# Patient Record
Sex: Female | Born: 1985 | Race: White | Hispanic: No | Marital: Single | Smoking: Current some day smoker
Health system: Southern US, Community
[De-identification: ages and names within clinical notes are randomized; demographics above are authoritative.]

## PROBLEM LIST (undated history)

## (undated) DIAGNOSIS — F32A Depression, unspecified: Secondary | ICD-10-CM

## (undated) DIAGNOSIS — F419 Anxiety disorder, unspecified: Secondary | ICD-10-CM

## (undated) DIAGNOSIS — Z789 Other specified health status: Secondary | ICD-10-CM

## (undated) HISTORY — DX: Anxiety disorder, unspecified: F41.9

## (undated) HISTORY — DX: Depression, unspecified: F32.A

---

## 2009-09-26 DIAGNOSIS — Z8614 Personal history of Methicillin resistant Staphylococcus aureus infection: Secondary | ICD-10-CM

## 2009-09-26 HISTORY — DX: Personal history of Methicillin resistant Staphylococcus aureus infection: Z86.14

## 2012-06-19 ENCOUNTER — Ambulatory Visit: Payer: Self-pay | Admitting: Obstetrics and Gynecology

## 2012-06-26 ENCOUNTER — Ambulatory Visit: Payer: Self-pay | Admitting: Obstetrics and Gynecology

## 2012-07-27 ENCOUNTER — Ambulatory Visit: Payer: Self-pay | Admitting: Obstetrics and Gynecology

## 2012-08-12 ENCOUNTER — Inpatient Hospital Stay: Payer: Self-pay | Admitting: Obstetrics and Gynecology

## 2012-08-12 LAB — CBC WITH DIFFERENTIAL/PLATELET
Basophil #: 0 10*3/uL (ref 0.0–0.1)
Eosinophil %: 1.3 %
HCT: 36.1 % (ref 35.0–47.0)
HGB: 12.8 g/dL (ref 12.0–16.0)
Lymphocyte %: 19.9 %
MCV: 86 fL (ref 80–100)
Monocyte #: 0.6 x10 3/mm (ref 0.2–0.9)
Monocyte %: 6.2 %
Neutrophil %: 72.4 %
Platelet: 223 10*3/uL (ref 150–440)
RBC: 4.17 10*6/uL (ref 3.80–5.20)
RDW: 13.7 % (ref 11.5–14.5)

## 2014-05-27 ENCOUNTER — Ambulatory Visit: Payer: Self-pay | Admitting: Family Medicine

## 2015-01-13 NOTE — Op Note (Signed)
PATIENT NAME:  Brenda Phillips, Brenda R MR#:  [id number] DATE OF BIRTH:  03/27/1986  DATE OF PROCEDURE:  08/13/2012  PREOPERATIVE DIAGNOSIS: Active phase arrest.  POSTOPERATIVE DIAGNOSIS: Active phase arrest.  PROCEDURE: Primary low transverse cesarean section.   SURGEON: Jennell Cornerhomas Gaege Sangalang, MD  ASSISTANHilda Blades: Lugiano  ANESTHESIA: Surgical dosing of continuous lumbar epidural.  INDICATIONS: This is a 29 year old gravida 1, para 0, a patient brought in the night before for gestational diabetes and underwent Cervidil induction and labored for better part of the day, did not progress past 5 cm despite adequate contraction pattern, managed by nurse midwife, Hilda BladesLugiano.  DESCRIPTION OF PROCEDURE: After adequate surgical dosing of continuous lumbar epidural, the patient was prepped and draped in normal sterile fashion. The patient did receive 3 grams IV Ancef prior to commencement of the case.   A Pfannenstiel incision was made two fingerbreadths above the symphysis pubis. Sharp dissection was used to identify the fascia. The subcutaneous fat, approximately 4 cm. Fascia was identified, opened, and opened in transverse fashion. The superior aspect of the fascia was grasped with Kocher clamps. The recti muscle was dissected. The inferior aspect of the fascia was grasped with Kocher clamps and the pyramidalis muscle was dissected without difficulty. Peritoneal cavity was opened with sharp dissection without difficulty and the vesicouterine peritoneal fold was identified and opened and a bladder flap was created and the bladder was reflected inferiorly. A low transverse uterine incision was made. Upon entry into the endometrial cavity, clear fluid resulted. The incision was extended with blunt transverse traction. Wedged fetal head was brought to the incision and delivery of the head, shoulders, and body accomplished difficulty, a vigorous female whose cord was doubly clamped, passed to the neonatologist, Dr. Abundio MiuHorowitz, who  assigned Apgar scores of 9 and 9. Placenta was manually delivered. The uterus was exteriorized. The endometrial cavity was wiped clean with laparotomy tape. The uterine incision was closed with one chromic suture in running locking fashion with good approximation of edges, good hemostasis was noted. The ovaries and the fallopian tubes appeared normal. The posterior cul-de-sac was irrigated and suctioned. The uterus was placed back into the abdominal cavity and the paracolic gutters were wiped clean with laparotomy tape and the uterine incision again appeared hemostatic. Interceed was placed over the lower uterine incision site, in T-shaped fashion. The superior aspect of the fascia was grasped with Kocher clamps and the On-Q pump catheters were placed subfascially. The fascia was then closed above this with 0 Vicryl suture with good approximation of edges, good hemostasis noted. The subcutaneous tissues were irrigated and bovied for hemostasis and the subcutaneous tissue was closed with interrupted 2-0 Vicryl suture given the depth of the subcutaneous fat. The skin was reapproximated with Insorb staples and the On-Q pump catheters were secured at the skin level with Dermabond, Steri-Strips were placed, and Tegaderm placed over previously placed Steri-Strips. Each catheter was loaded with 5 mL of 0.5% Marcaine. There were no complications. The patient tolerated the procedure well. Estimated blood was 700 mL, intraoperative fluids 700 mL, and urine output 100 mL. The patient was taken to the recovery room in good condition.  ____________________________ Suzy Bouchardhomas J. Tennie Grussing, MD tjs:slb D: 08/13/2012 17:02:49 ET T: 08/14/2012 08:33:06 ET JOB#: [id number]  cc: Suzy Bouchardhomas J. Yazeed Pryer, MD, <Dictator> Suzy BouchardHOMAS J Gary Bultman MD ELECTRONICALLY SIGNED 08/20/2012 15:02

## 2015-02-03 NOTE — H&P (Signed)
L&D Evaluation:  History:   HPI 29 y/o G1 @ 39+wks EDC here for IOl @ term due to Gestational diabetes-well controlled with diet exercise. Care @ [facility]. Pregnancy also complicated by extreme obesity. Upon admission irregular mild contractions, denies leaking fluid or vaginal bleeding, baby is active. GBS negative.    Presents with above    Patient's Medical History No Chronic Illness    Patient's Surgical History none  (poor dental care) multiple teeth removed @ beginning of pregnancy    Medications Pre Natal Vitamins    Allergies NKDA    Social History none    Family History Non-Contributory   Exam:   Vital Signs stable    Urine Protein not completed, to lab    General no apparent distress    Mental Status clear    Chest clear  head cold past few days (feels bettter this am)    Heart normal sinus rhythm    Abdomen gravid, non-tender    Estimated Fetal Weight Average for gestational age    Fetal Position vtx    Fundal Height term    Back no CVAT    Edema 1+  pedal    Reflexes 1+    Clonus negative    Pelvic no external lesions, 2cm 50% vtx @ -1 BBOW nl show    Mebranes Intact    FHT normal rate with no decels, baseline 140's 150's avg variability with accels    Fetal Heart Rate 144    Ucx regular, q 2 mins 60 sec moderate    Skin dry    Lymph no lymphadenopathy   Impression:   Impression early labor, term IOL GDM   Plan:   Plan EFM/NST, monitor contractions and for cervical change    Comments Cervidil removed, pt requests epidural before AROM/beginning pitocin. Anesthesai notified. VSS AF FHR reactive, regular contractions. Explained what to expect, questions answererd. FOB @ bedside, supportive.   Electronic Signatures: Albertina ParrLugiano, Awesome Jared B (CNM)  (Signed [phone]-Nov-13 08:40)  Authored: L&D Evaluation   Last Updated: 18-Nov-13 08:40 by Albertina ParrLugiano, [place] Vanaken B (CNM)

## 2018-09-22 ENCOUNTER — Inpatient Hospital Stay
Admission: EM | Admit: 2018-09-22 | Discharge: 2018-09-24 | DRG: 871 | Disposition: A | Discharge status: Home / Self Care | Payer: Medicaid Other | Attending: Specialist | Admitting: Specialist

## 2018-09-22 ENCOUNTER — Other Ambulatory Visit: Payer: Self-pay

## 2018-09-22 ENCOUNTER — Emergency Department: Payer: Medicaid Other

## 2018-09-22 ENCOUNTER — Encounter: Payer: Self-pay | Admitting: Emergency Medicine

## 2018-09-22 DIAGNOSIS — Z91048 Other nonmedicinal substance allergy status: Secondary | ICD-10-CM

## 2018-09-22 DIAGNOSIS — E876 Hypokalemia: Secondary | ICD-10-CM | POA: Diagnosis present

## 2018-09-22 DIAGNOSIS — R7301 Impaired fasting glucose: Secondary | ICD-10-CM | POA: Diagnosis present

## 2018-09-22 DIAGNOSIS — J189 Pneumonia, unspecified organism: Secondary | ICD-10-CM | POA: Diagnosis present

## 2018-09-22 DIAGNOSIS — Z833 Family history of diabetes mellitus: Secondary | ICD-10-CM

## 2018-09-22 DIAGNOSIS — E871 Hypo-osmolality and hyponatremia: Secondary | ICD-10-CM | POA: Diagnosis present

## 2018-09-22 DIAGNOSIS — Z23 Encounter for immunization: Secondary | ICD-10-CM | POA: Diagnosis not present

## 2018-09-22 DIAGNOSIS — Z6841 Body Mass Index (BMI) 40.0 and over, adult: Secondary | ICD-10-CM

## 2018-09-22 DIAGNOSIS — Z87891 Personal history of nicotine dependence: Secondary | ICD-10-CM | POA: Diagnosis not present

## 2018-09-22 DIAGNOSIS — Z882 Allergy status to sulfonamides status: Secondary | ICD-10-CM

## 2018-09-22 DIAGNOSIS — A419 Sepsis, unspecified organism: Secondary | ICD-10-CM | POA: Diagnosis not present

## 2018-09-22 DIAGNOSIS — J9601 Acute respiratory failure with hypoxia: Secondary | ICD-10-CM | POA: Diagnosis present

## 2018-09-22 DIAGNOSIS — F17210 Nicotine dependence, cigarettes, uncomplicated: Secondary | ICD-10-CM | POA: Diagnosis present

## 2018-09-22 DIAGNOSIS — R0602 Shortness of breath: Secondary | ICD-10-CM | POA: Diagnosis present

## 2018-09-22 DIAGNOSIS — R0902 Hypoxemia: Secondary | ICD-10-CM

## 2018-09-22 HISTORY — DX: Other specified health status: Z78.9

## 2018-09-22 LAB — CBC WITH DIFFERENTIAL/PLATELET
Abs Immature Granulocytes: 0.11 10*3/uL — ABNORMAL HIGH (ref 0.00–0.07)
Basophils Absolute: 0 10*3/uL (ref 0.0–0.1)
Basophils Relative: 0 %
Eosinophils Absolute: 0 10*3/uL (ref 0.0–0.5)
Eosinophils Relative: 0 %
HCT: 41.4 % (ref 36.0–46.0)
Hemoglobin: 14.2 g/dL (ref 12.0–15.0)
Immature Granulocytes: 1 %
LYMPHS PCT: 14 %
Lymphs Abs: 1.7 10*3/uL (ref 0.7–4.0)
MCH: 29.8 pg (ref 26.0–34.0)
MCHC: 34.3 g/dL (ref 30.0–36.0)
MCV: 86.8 fL (ref 80.0–100.0)
Monocytes Absolute: 1 10*3/uL (ref 0.1–1.0)
Monocytes Relative: 9 %
Neutro Abs: 9 10*3/uL — ABNORMAL HIGH (ref 1.7–7.7)
Neutrophils Relative %: 76 %
Platelets: 402 10*3/uL — ABNORMAL HIGH (ref 150–400)
RBC: 4.77 MIL/uL (ref 3.87–5.11)
RDW: 13 % (ref 11.5–15.5)
WBC: 11.9 10*3/uL — AB (ref 4.0–10.5)
nRBC: 0 % (ref 0.0–0.2)

## 2018-09-22 LAB — URINALYSIS, COMPLETE (UACMP) WITH MICROSCOPIC
BACTERIA UA: NONE SEEN
Bilirubin Urine: NEGATIVE
Glucose, UA: NEGATIVE mg/dL
Hgb urine dipstick: NEGATIVE
KETONES UR: NEGATIVE mg/dL
Leukocytes, UA: NEGATIVE
Nitrite: NEGATIVE
Protein, ur: NEGATIVE mg/dL
Specific Gravity, Urine: 1.028 (ref 1.005–1.030)
pH: 7 (ref 5.0–8.0)

## 2018-09-22 LAB — EXPECTORATED SPUTUM ASSESSMENT W GRAM STAIN, RFLX TO RESP C: Special Requests: NORMAL

## 2018-09-22 LAB — BASIC METABOLIC PANEL
Anion gap: 13 (ref 5–15)
BUN: 6 mg/dL (ref 6–20)
CO2: 28 mmol/L (ref 22–32)
CREATININE: 0.67 mg/dL (ref 0.44–1.00)
Calcium: 8.9 mg/dL (ref 8.9–10.3)
Chloride: 93 mmol/L — ABNORMAL LOW (ref 98–111)
GFR calc Af Amer: >60 mL/min (ref >60)
GFR calc non Af Amer: >60 mL/min (ref >60)
Glucose, Bld: 121 mg/dL — ABNORMAL HIGH (ref 70–99)
Potassium: 2.9 mmol/L — ABNORMAL LOW (ref 3.5–5.1)
SODIUM: 134 mmol/L — AB (ref 135–145)

## 2018-09-22 LAB — I-STAT BETA HCG BLOOD, ED (NOT ORDERABLE): I-stat hCG, quantitative: <5 m[IU]/mL (ref <5)

## 2018-09-22 LAB — CG4 I-STAT (LACTIC ACID): LACTIC ACID, VENOUS: 1.57 mmol/L (ref 0.5–1.9)

## 2018-09-22 LAB — BRAIN NATRIURETIC PEPTIDE: B Natriuretic Peptide: 44 pg/mL (ref 0.0–100.0)

## 2018-09-22 LAB — PROCALCITONIN: Procalcitonin: 0.15 ng/mL

## 2018-09-22 LAB — TROPONIN I: Troponin I: <0.03 ng/mL (ref <0.03)

## 2018-09-22 LAB — MRSA PCR SCREENING: MRSA by PCR: NEGATIVE

## 2018-09-22 LAB — INFLUENZA PANEL BY PCR (TYPE A & B)
INFLBPCR: NEGATIVE
Influenza A By PCR: NEGATIVE

## 2018-09-22 MED ORDER — POTASSIUM CHLORIDE IN NACL 20-0.9 MEQ/L-% IV SOLN
INTRAVENOUS | Status: DC
  Administered 2018-09-22 – 2018-09-23 (×3): via INTRAVENOUS
  Filled 2018-09-22 (×5): qty 1000

## 2018-09-22 MED ORDER — BUDESONIDE 0.5 MG/2ML IN SUSP
0.5000 mg | Freq: Two times a day (BID) | RESPIRATORY_TRACT | Status: DC
  Administered 2018-09-22 – 2018-09-24 (×4): 0.5 mg via RESPIRATORY_TRACT
  Filled 2018-09-22 (×4): qty 2

## 2018-09-22 MED ORDER — ENOXAPARIN SODIUM 40 MG/0.4ML ~~LOC~~ SOLN
40.0000 mg | SUBCUTANEOUS | Status: DC
  Administered 2018-09-22: 23:00:00 40 mg via SUBCUTANEOUS
  Filled 2018-09-22: qty 0.4

## 2018-09-22 MED ORDER — POTASSIUM CHLORIDE CRYS ER 20 MEQ PO TBCR
40.0000 meq | EXTENDED_RELEASE_TABLET | Freq: Once | ORAL | Status: AC
  Administered 2018-09-22: 40 meq via ORAL
  Filled 2018-09-22: qty 2

## 2018-09-22 MED ORDER — GUAIFENESIN-DM 100-10 MG/5ML PO SYRP
5.0000 mL | ORAL_SOLUTION | ORAL | Status: DC | PRN
  Administered 2018-09-23 (×4): 5 mL via ORAL
  Filled 2018-09-22 (×5): qty 5

## 2018-09-22 MED ORDER — METHYLPREDNISOLONE SODIUM SUCC 125 MG IJ SOLR
125.0000 mg | Freq: Once | INTRAMUSCULAR | Status: AC
  Administered 2018-09-22: 125 mg via INTRAVENOUS
  Filled 2018-09-22: qty 2

## 2018-09-22 MED ORDER — SODIUM CHLORIDE 0.9 % IV SOLN
1.0000 g | Freq: Once | INTRAVENOUS | Status: AC
  Administered 2018-09-22: 1 g via INTRAVENOUS
  Filled 2018-09-22: qty 10

## 2018-09-22 MED ORDER — ACETAMINOPHEN 650 MG RE SUPP: 650.0000 mg | Freq: Four times a day (QID) | RECTAL | Status: DC | PRN

## 2018-09-22 MED ORDER — MAGNESIUM SULFATE 2 GM/50ML IV SOLN
2.0000 g | Freq: Once | INTRAVENOUS | Status: AC
  Administered 2018-09-22: 2 g via INTRAVENOUS
  Filled 2018-09-22: qty 50

## 2018-09-22 MED ORDER — ONDANSETRON HCL 4 MG PO TABS: 4.0000 mg | ORAL_TABLET | Freq: Four times a day (QID) | ORAL | Status: DC | PRN

## 2018-09-22 MED ORDER — IPRATROPIUM-ALBUTEROL 0.5-2.5 (3) MG/3ML IN SOLN
3.0000 mL | Freq: Four times a day (QID) | RESPIRATORY_TRACT | Status: DC
  Administered 2018-09-22 – 2018-09-24 (×7): 3 mL via RESPIRATORY_TRACT
  Filled 2018-09-22 (×8): qty 3

## 2018-09-22 MED ORDER — ALBUTEROL SULFATE (2.5 MG/3ML) 0.083% IN NEBU
5.0000 mg | INHALATION_SOLUTION | Freq: Once | RESPIRATORY_TRACT | Status: AC
  Administered 2018-09-22: 5 mg via RESPIRATORY_TRACT
  Filled 2018-09-22: qty 6

## 2018-09-22 MED ORDER — ACETAMINOPHEN 325 MG PO TABS: 650.0000 mg | ORAL_TABLET | Freq: Four times a day (QID) | ORAL | Status: DC | PRN

## 2018-09-22 MED ORDER — ONDANSETRON HCL 4 MG/2ML IJ SOLN: 4.0000 mg | Freq: Four times a day (QID) | INTRAMUSCULAR | Status: DC | PRN

## 2018-09-22 MED ORDER — IOPAMIDOL (ISOVUE-370) INJECTION 76%
75.0000 mL | Freq: Once | INTRAVENOUS | Status: AC | PRN
  Administered 2018-09-22: 75 mL via INTRAVENOUS

## 2018-09-22 MED ORDER — SODIUM CHLORIDE 0.9 % IV BOLUS
1000.0000 mL | Freq: Once | INTRAVENOUS | Status: AC
  Administered 2018-09-22: 1000 mL via INTRAVENOUS

## 2018-09-22 MED ORDER — METHYLPREDNISOLONE SODIUM SUCC 40 MG IJ SOLR
40.0000 mg | Freq: Every day | INTRAMUSCULAR | Status: DC
  Administered 2018-09-23 – 2018-09-24 (×2): 40 mg via INTRAVENOUS
  Filled 2018-09-22 (×2): qty 1

## 2018-09-22 MED ORDER — IPRATROPIUM BROMIDE 0.02 % IN SOLN
0.5000 mg | Freq: Once | RESPIRATORY_TRACT | Status: AC
  Administered 2018-09-22: 0.5 mg via RESPIRATORY_TRACT
  Filled 2018-09-22: qty 2.5

## 2018-09-22 MED ORDER — INFLUENZA VAC SPLIT QUAD 0.5 ML IM SUSY
0.5000 mL | PREFILLED_SYRINGE | INTRAMUSCULAR | Status: AC
  Administered 2018-09-24: 0.5 mL via INTRAMUSCULAR
  Filled 2018-09-22: qty 0.5

## 2018-09-22 MED ORDER — SODIUM CHLORIDE 0.9 % IV SOLN
500.0000 mg | Freq: Once | INTRAVENOUS | Status: AC
  Administered 2018-09-22: 500 mg via INTRAVENOUS
  Filled 2018-09-22: qty 500

## 2018-09-22 MED ORDER — GUAIFENESIN 100 MG/5ML PO SOLN
5.0000 mL | ORAL | Status: DC | PRN
  Filled 2018-09-22: qty 5

## 2018-09-22 MED ORDER — SODIUM CHLORIDE 0.9 % IV SOLN
500.0000 mg | INTRAVENOUS | Status: DC
  Administered 2018-09-23: 500 mg via INTRAVENOUS
  Filled 2018-09-22 (×2): qty 500

## 2018-09-22 MED ORDER — SODIUM CHLORIDE 0.9 % IV SOLN
1.0000 g | INTRAVENOUS | Status: DC
  Administered 2018-09-23 – 2018-09-24 (×2): 1 g via INTRAVENOUS
  Filled 2018-09-22 (×2): qty 1

## 2018-09-22 NOTE — ED Provider Notes (Addendum)
[facility] Emergency Department Provider Note  ____________________________________________   I have reviewed the triage vital signs and the nursing notes. Where available I have reviewed prior notes and, if possible and indicated, outside hospital notes.    HISTORY  Chief Complaint Shortness of Breath    HPI Brenda Phillips is a 32 y.o. female who does not have any significant medical problem, is a reformed smoker, smokes 1 or 2 cigarettes a day instead of the larger number she used to.  Does not vape, states that she has been having a cough and cold symptoms which she is showing with her children off and on this month.  She felt better earlier this month and then 4 days ago got sick again with URI symptoms cough cold rhinorrhea fever.  She denies any chest pain or leg swelling.  No personal family history of PE or DVT.  She states that over the last 2 days she is really been coughing a lot.  Cough is sometimes productive.  T-max is 101.9.  No personal family history of PE or DVT no recent travel.  Patient states that she just has trouble catching her breath with her coughing.  She is not on oxygen at baseline.  Sats were in the 80s for the outside clinic.    History reviewed. No pertinent past medical history.  There are no active problems to display for this patient.   Past Surgical History:  Procedure Laterality Date  . CESAREAN SECTION     x 2    Prior to Admission medications   Not on File    Allergies Sulfa antibiotics and Yellow dyes (non-tartrazine)  No family history on file.  Social History Social History   Tobacco Use  . Smoking status: Current Every Day Smoker  . Smokeless tobacco: Never Used  Substance Use Topics  . Alcohol use: Not Currently  . Drug use: Not Currently    Review of Systems Constitutional: + fever/chills Eyes: No visual changes. ENT: Positive mild sore throat. No stiff neck no neck pain, positive  rhinorrhea Cardiovascular: Denies chest pain. Respiratory:  + shortness of breath. Gastrointestinal:   no vomiting.  No diarrhea.  No constipation. Genitourinary: Negative for dysuria. Musculoskeletal: Negative lower extremity swelling Skin: Negative for rash. Neurological: Negative for severe headaches, focal weakness or numbness.   ____________________________________________   PHYSICAL EXAM:  VITAL SIGNS: ED Triage Vitals  Enc Vitals Group     BP 09/22/18 1306 (!) 149/73     Pulse Rate 09/22/18 1252 (!) 135     Resp 09/22/18 1252 20     Temp 09/22/18 1302 99.1 F (37.3 C)     Temp Source 09/22/18 1302 Oral     SpO2 09/22/18 1252 (!) 85 %     Weight 09/22/18 1259 233 lb (105.7 kg)     Height 09/22/18 1259 5\' 3"  (1.6 m)     Head Circumference --      Peak Flow --      Pain Score 09/22/18 1259 0     Pain Loc --      Pain Edu? --      Excl. in GC? --     Constitutional: Alert and oriented.  And actually appears better than her sats would suggest, she is not in significant distress but she is coughing and wheezing. Eyes: Conjunctivae are normal Head: Atraumatic HEENT: No congestion/rhinnorhea. Mucous membranes are moist.  Oropharynx non-erythematous Neck:   Nontender with no meningismus, no masses,  no stridor Cardiovascular: N mild tachycardia noted , regular rhythm. Grossly normal heart sounds.  Good peripheral circulation. Respiratory: Normal respiratory effort.  Cough and diffuse wheeze appreciated. Abdominal: Soft and nontender. No distention. No guarding no rebound Back:  There is no focal tenderness or step off.  there is no midline tenderness there are no lesions noted. there is no CVA tenderness Musculoskeletal: No lower extremity tenderness, no upper extremity tenderness. No joint effusions, no DVT signs strong distal pulses no edema Neurologic:  Normal speech and language. No gross focal neurologic deficits are appreciated.  Skin:  Skin is warm, dry and intact.  No rash noted. Psychiatric: Mood and affect are normal. Speech and behavior are normal.  ____________________________________________   LABS (all labs ordered are listed, but only abnormal results are displayed)  Labs Reviewed  CBC WITH DIFFERENTIAL/PLATELET - Abnormal; Notable for the following components:      Result Value   WBC 11.9 (*)    Platelets 402 (*)    Neutro Abs 9.0 (*)    Abs Immature Granulocytes 0.11 (*)    All other components within normal limits  CULTURE, BLOOD (ROUTINE X 2)  CULTURE, BLOOD (ROUTINE X 2)  BASIC METABOLIC PANEL  BRAIN NATRIURETIC PEPTIDE  TROPONIN I  URINALYSIS, COMPLETE (UACMP) WITH MICROSCOPIC  INFLUENZA PANEL BY PCR (TYPE A & B)  I-STAT CG4 LACTIC ACID, ED  I-STAT BETA HCG BLOOD, ED ([facility], [facility], [facility] ONLY)  CG4 I-STAT (LACTIC ACID)  I-STAT BETA HCG BLOOD, ED (NOT ORDERABLE)    Pertinent labs  results that were available during my care of the patient were reviewed by me and considered in my medical decision making (see chart for details). ____________________________________________  EKG  I personally interpreted any EKGs ordered by me or triage Tachycardia noted rate 104, sinus, no acute ST elevation depression nonspecific ST changes, no acute ischemia  ____________________________________________  RADIOLOGY  Pertinent labs & imaging results that were available during my care of the patient were reviewed by me and considered in my medical decision making (see chart for details). If possible, patient and/or family made aware of any abnormal findings.  Dg Chest 2 View  Result Date: 09/22/2018 CLINICAL DATA:  Patient with cough and congestion EXAM: CHEST - 2 VIEW COMPARISON:  None. FINDINGS: Monitoring leads overlie the patient. Normal cardiac and mediastinal contours. Coarse interstitial opacities within the right mid and lower lung and left lower lung. No pleural effusion. Regional skeleton is unremarkable. IMPRESSION: Right mid and lower  lung and left lung base coarse interstitial opacities may represent pneumonia or atypical infectious process. Given the appearance of the findings, recommend short-term follow-up chest radiograph to assess for interval improvement/resolution. Electronically Signed   By: Annia Beltrew  Davis M.D.   On: 09/22/2018 13:40    ----------------------------------------- 2:34 PM on 09/22/2018 -----------------------------------------  I personally looked at chest x-ray and CT scan, discussed ct with radiologist. ____________________________________________    PROCEDURES  Procedure(s) performed: None  Procedures  Critical Care performed: CRITICAL CARE Performed by: Jeanmarie PlantJAMES A Ronan Dion   Total critical care time: 45 minutes  Critical care time was exclusive of separately billable procedures and treating other patients.  Critical care was necessary to treat or prevent imminent or life-threatening deterioration.  Critical care was time spent personally by me on the following activities: development of treatment plan with patient and/or surrogate as well as nursing, discussions with consultants, evaluation of patient's response to treatment, examination of patient, obtaining history from patient or surrogate, ordering and performing  treatments and interventions, ordering and review of laboratory studies, ordering and review of radiographic studies, pulse oximetry and re-evaluation of patient's condition.   ____________________________________________   INITIAL IMPRESSION / ASSESSMENT AND PLAN / ED COURSE  Pertinent labs & imaging results that were available during my care of the patient were reviewed by me and considered in my medical decision making (see chart for details).  Patient here with cough and wheeze and URI symptoms and fevers at home.  She does not appear to be septic lactic is negative, we are we placed her on oxygen her sats at this time are at 98 on a neb.  She is breathing more easily  after the nebulizer treatment.  She is not pregnant.  Chest x-ray shows either pneumonia or atypical infectious process, given significant hypoxia we will evaluate whether CT is indicated for possible PE as patient did start her control about a month ago.  However, there is seems to be clear infectious process causing this  . ----------------------------------------- 2:34 PM on 09/22/2018 -----------------------------------------  Patient doing somewhat better after nebs, still nontoxic certainly does not need to be intubated, we are starting broad-spectrum with atypical coverage given patient CT finding of diffuse interstitial lung disease, patient does state that she does not vape she is a smoker she has been sick off and on this month starting early December she states got acutely worse over the last couple days.  Given hypoxia, we will admit her to the hospital I did talk to the hospitalist and they agree with management.  Appreciate consult   ____________________________________________   FINAL CLINICAL IMPRESSION(S) / ED DIAGNOSES  Final diagnoses:  None      This chart was dictated using voice recognition software.  Despite best efforts to proofread,  errors can occur which can change meaning.      Jeanmarie PlantMcShane, Bob Daversa A, MD 09/22/18 1348    Jeanmarie PlantMcShane, Keishawna Carranza A, MD 09/22/18 1435    Jeanmarie PlantMcShane, Jenesis Suchy A, MD 09/22/18 [phone]

## 2018-09-22 NOTE — ED Notes (Signed)
Antibiotics started prior to collectin of second blood culture due to difficult stick.

## 2018-09-22 NOTE — ED Notes (Signed)
Patient transported to X-ray 

## 2018-09-22 NOTE — H&P (Signed)
[facility] [place] [facility]   PATIENT NAME: Brenda Phillips    MR#:  [id number]  DATE OF BIRTH:  08-05-1986  DATE OF ADMISSION:  09/22/2018  PRIMARY CARE PHYSICIAN: [facility]   REQUESTING/REFERRING PHYSICIAN: Dr Ileana RoupJames McShane  CHIEF COMPLAINT:   Chief Complaint  Patient presents with  . Shortness of Breath    HISTORY OF PRESENT ILLNESS:  Brenda Alertmanda Phillips  is a 32 y.o. female presenting with shortness of breath.  She states that she has been having trouble breathing for the last 3 to 4 days.  Of eyes have been watering and crusting.  She has been coughing up greenish phlegm.  Her left ear has been hurting and decreased hearing and ringing.  She has had a fever up to almost 102 at home.  She went to her doctor and they sent her into the ER for further evaluation.  In the ER she was found to be hypoxic with a pulse ox in the 80s on room air.  He was also found to have pneumonia on chest x-ray.  CT scan of the chest was ordered by the ER physician and still pending at this time.  Hospitalist services were contacted for further evaluation.  Of note patient does not vape.  She states she did vape around 3 years ago but not now.  PAST MEDICAL HISTORY:   Past Medical History:  Diagnosis Date  . Medical history non-contributory     PAST SURGICAL HISTORY:   Past Surgical History:  Procedure Laterality Date  . CESAREAN SECTION     x 2    SOCIAL HISTORY:   Social History   Tobacco Use  . Smoking status: Former Games developermoker  . Smokeless tobacco: Never Used  Substance Use Topics  . Alcohol use: Not Currently    FAMILY HISTORY:   Family History  Problem Relation Age of Onset  . Diabetes Mother   . Liver disease Father     DRUG ALLERGIES:   Allergies  Allergen Reactions  . Sulfa Antibiotics   . Yellow Dyes (Non-Tartrazine)     REVIEW OF SYSTEMS:  CONSTITUTIONAL: Positive for fever, chills and sweats.  Positive for  weight loss.  Positive for fatigue.  EYES: Positive for crusting on her eyes. EARS, NOSE, AND THROAT: Positive for tinnitus.  Ear pain and decreased hearing.  Positive for runny nose.  No sore throat. RESPIRATORY: Positive for cough and shortness of breath.no wheezing or hemoptysis.  CARDIOVASCULAR: No chest pain, orthopnea, edema.  GASTROINTESTINAL: No nausea, vomiting, or abdominal pain. No blood in bowel movements.  Some diarrhea GENITOURINARY: No dysuria.  Positive for dark urine ENDOCRINE: No polyuria, nocturia,  HEMATOLOGY: No anemia, easy bruising or bleeding SKIN: No rash or lesion. MUSCULOSKELETAL: Some neck and back pain. NEUROLOGIC: No tingling, numbness, weakness.  PSYCHIATRY: No anxiety or depression.   MEDICATIONS AT HOME:   Prior to Admission medications   Medication Sig Start Date End Date Taking? Authorizing Provider  norgestimate-ethinyl estradiol (SPRINTEC 28) 0.25-35 MG-MCG tablet Take 1 tablet by mouth daily.   Yes [provider]      VITAL SIGNS:  Blood pressure 122/71, pulse (!) 109, temperature 99.1 F (37.3 C), temperature source Oral, resp. rate 15, height 5\' 3"  (1.6 m), weight 105.7 kg, last menstrual period 09/13/2018, SpO2 (!) 88 %.  PHYSICAL EXAMINATION:  GENERAL:  32 y.o.-year-old patient lying in the bed with no acute distress.  EYES: Pupils equal, round, reactive to light and  accommodation. No scleral icterus. Extraocular muscles intact.  HEENT: Head atraumatic, normocephalic. Oropharynx and nasopharynx clear.  NECK:  Supple, no jugular venous distention. No thyroid enlargement, no tenderness.  LUNGS: Decreased breath sounds bilaterally.  Slight expiratory wheezing.  No rales,rhonchi or crepitation. No use of accessory muscles of respiration.  CARDIOVASCULAR: S1, S2 tachycardia. No murmurs, rubs, or gallops.  ABDOMEN: Soft, nontender, nondistended. Bowel sounds present. No organomegaly or mass.  EXTREMITIES: No pedal edema, cyanosis, or  clubbing.  NEUROLOGIC: Cranial nerves II through XII are intact. Muscle strength 5/5 in all extremities. Sensation intact. Gait not checked.  PSYCHIATRIC: The patient is alert and oriented x 3.  SKIN: No rash, lesion, or ulcer.   LABORATORY PANEL:   CBC Recent Labs  Lab 09/22/18 1307  WBC 11.9*  HGB 14.2  HCT 41.4  PLT 402*   ------------------------------------------------------------------------------------------------------------------  Chemistries  Recent Labs  Lab 09/22/18 1307  NA 134*  K 2.9*  CL 93*  CO2 28  GLUCOSE 121*  BUN 6  CREATININE 0.67  CALCIUM 8.9   ------------------------------------------------------------------------------------------------------------------    RADIOLOGY:  Dg Chest 2 View  Result Date: 09/22/2018 CLINICAL DATA:  Patient with cough and congestion EXAM: CHEST - 2 VIEW COMPARISON:  None. FINDINGS: Monitoring leads overlie the patient. Normal cardiac and mediastinal contours. Coarse interstitial opacities within the right mid and lower lung and left lower lung. No pleural effusion. Regional skeleton is unremarkable. IMPRESSION: Right mid and lower lung and left lung base coarse interstitial opacities may represent pneumonia or atypical infectious process. Given the appearance of the findings, recommend short-term follow-up chest radiograph to assess for interval improvement/resolution. Electronically Signed   By: Annia Beltrew  Davis M.D.   On: 09/22/2018 13:40    EKG:   Sinus tachycardia, left atrial enlargement, nonspecific ST-T wave changes  IMPRESSION AND PLAN:   1.  Clinical sepsis with bilateral pneumonia.  IV Rocephin and Zithromax.  Follow-up blood cultures.  Sputum culture ordered.  Check a procalcitonin. 2.  Acute hypoxic respiratory failure.  Oxygen supplementation.  Steroids given in the emergency room.  I will continue low-dose steroids currently. 3.  Hypokalemia give IV magnesium and oral and IV potassium 4.  Hyponatremia IV  fluid hydration. 5.  Obesity with a BMI of 41.27.  Weight loss needed 6.  Impaired fasting glucose add on a hemoglobin A1c   All the records are reviewed and case discussed with ED provider. Management plans discussed with the patient, family and they are in agreement.  CODE STATUS: Full code  TOTAL TIME TAKING CARE OF THIS PATIENT: 50 minutes.    Alford Highlandichard Elfida Shimada M.D on 09/22/2018 at 2:50 PM  Between 7am to 6pm - Pager - [phone]  After 6pm call admission pager [phone]  [facility] Office  [phone]  CC: Primary care physician; [facility]

## 2018-09-22 NOTE — ED Triage Notes (Signed)
[facility] Upon arrival pts SpO2 was 85% on room air. Pt was placed on 2 liter O2 via [place] and taken to treatment room 4

## 2018-09-22 NOTE — ED Notes (Addendum)
Stuck for blood cultures by this RN unsuccessful. Lab aware that second set of cultures have not been drawn.

## 2018-09-22 NOTE — ED Notes (Signed)
Patient transported to CT 

## 2018-09-22 NOTE — ED Triage Notes (Signed)
First Nurse Note:  Arrives from [facility] for [facility] evaluation of cough, sob.  According to paperwork, sats were 85%.  OX sat checked in lobby - 85% and pulse 134.  Placed patient on 2l/[place] in Triage 1.

## 2018-09-22 NOTE — ED Notes (Signed)
Pt given specimen cup to collect respiratory panel.

## 2018-09-22 NOTE — ED Notes (Signed)
Nurse not able to take report at this time. Will call back.

## 2018-09-23 LAB — RESPIRATORY PANEL BY PCR
Adenovirus: NOT DETECTED
Bordetella pertussis: NOT DETECTED
Chlamydophila pneumoniae: NOT DETECTED
Coronavirus 229E: NOT DETECTED
Coronavirus HKU1: NOT DETECTED
Coronavirus NL63: NOT DETECTED
Coronavirus OC43: NOT DETECTED
INFLUENZA A-RVPPCR: NOT DETECTED
Influenza B: NOT DETECTED
Metapneumovirus: NOT DETECTED
Mycoplasma pneumoniae: NOT DETECTED
PARAINFLUENZA VIRUS 3-RVPPCR: NOT DETECTED
PARAINFLUENZA VIRUS 4-RVPPCR: NOT DETECTED
Parainfluenza Virus 1: NOT DETECTED
Parainfluenza Virus 2: NOT DETECTED
Respiratory Syncytial Virus: DETECTED — AB
Rhinovirus / Enterovirus: NOT DETECTED

## 2018-09-23 LAB — CBC
HEMATOCRIT: 36.1 % (ref 36.0–46.0)
Hemoglobin: 11.8 g/dL — ABNORMAL LOW (ref 12.0–15.0)
MCH: 29.5 pg (ref 26.0–34.0)
MCHC: 32.7 g/dL (ref 30.0–36.0)
MCV: 90.3 fL (ref 80.0–100.0)
Platelets: 370 10*3/uL (ref 150–400)
RBC: 4 MIL/uL (ref 3.87–5.11)
RDW: 13.2 % (ref 11.5–15.5)
WBC: 11.6 10*3/uL — ABNORMAL HIGH (ref 4.0–10.5)
nRBC: 0 % (ref 0.0–0.2)

## 2018-09-23 LAB — BASIC METABOLIC PANEL
Anion gap: 7 (ref 5–15)
BUN: 8 mg/dL (ref 6–20)
CO2: 26 mmol/L (ref 22–32)
Calcium: 8.2 mg/dL — ABNORMAL LOW (ref 8.9–10.3)
Chloride: 104 mmol/L (ref 98–111)
Creatinine, Ser: 0.5 mg/dL (ref 0.44–1.00)
GFR calc Af Amer: >60 mL/min (ref >60)
GFR calc non Af Amer: >60 mL/min (ref >60)
GLUCOSE: 143 mg/dL — AB (ref 70–99)
Potassium: 3.9 mmol/L (ref 3.5–5.1)
Sodium: 137 mmol/L (ref 135–145)

## 2018-09-23 LAB — PROCALCITONIN: Procalcitonin: <0.1 ng/mL

## 2018-09-23 MED ORDER — ENOXAPARIN SODIUM 40 MG/0.4ML ~~LOC~~ SOLN
40.0000 mg | Freq: Two times a day (BID) | SUBCUTANEOUS | Status: DC
  Administered 2018-09-23 (×2): 40 mg via SUBCUTANEOUS
  Filled 2018-09-23 (×2): qty 0.4

## 2018-09-23 NOTE — Progress Notes (Signed)
Patient ID: Brenda Phillips, female   DOB: 20-Apr-1986, 32 y.o.   MRN: [id number]  [employer] PROGRESS NOTE  [facility]anda [facility] WGN:[id number] DOB: 20-Apr-1986 DOA: 09/22/2018 [facility]CP: [facility]  HPI/Subjective: Patient feels like she is breathing better than yesterday.  Still with a dry cough.  Still with shortness of breath and wheeze.  Objective: Vitals:   09/22/18 2020 09/23/18 0607  BP: 123/70 129/75  Pulse: (!) 111 62  Resp: 16 12  Temp: 98.7 F (37.1 C) 97.6 F (36.4 C)  SpO2: 96% 95%    Filed Weights   09/22/18 1259  Weight: 105.7 kg    ROS: Review of Systems  Constitutional: Negative for chills and fever.  Eyes: Negative for blurred vision.  Respiratory: Positive for cough, shortness of breath and wheezing.   Cardiovascular: Negative for chest pain.  Gastrointestinal: Negative for abdominal pain, constipation, diarrhea, nausea and vomiting.  Genitourinary: Negative for dysuria.  Musculoskeletal: Negative for joint pain.  Neurological: Negative for dizziness and headaches.   Exam: Physical Exam  Constitutional: She is oriented to person, place, and time.  HENT:  Nose: No mucosal edema.  Mouth/Throat: No oropharyngeal exudate or posterior oropharyngeal edema.  Eyes: Pupils are equal, round, and reactive to light. Conjunctivae, EOM and lids are normal.  Neck: No JVD present. Carotid bruit is not present. No edema present. No thyroid mass and no thyromegaly present.  Cardiovascular: S1 normal and S2 normal. Tachycardia present. Exam reveals no gallop.  No murmur heard. Pulses:      Dorsalis pedis pulses are 2+ on the right side and 2+ on the left side.  Respiratory: No respiratory distress. She has decreased breath sounds in the right middle field, the right lower field, the left middle field and the left lower field. She has wheezes in the right lower field and the left lower field. She has no rhonchi. She has no rales.  GI: Soft.  Bowel sounds are normal. There is no abdominal tenderness.  Musculoskeletal:     Right ankle: She exhibits no swelling.     Left ankle: She exhibits no swelling.  Lymphadenopathy:    She has no cervical adenopathy.  Neurological: She is alert and oriented to person, place, and time. No cranial nerve deficit.  Skin: Skin is warm. No rash noted. Nails show no clubbing.  Psychiatric: She has a normal mood and affect.      Data Reviewed: Basic Metabolic Panel: Recent Labs  Lab 09/22/18 1307 09/23/18 0609  NA 134* 137  K 2.9* 3.9  CL 93* 104  CO2 28 26  GLUCOSE 121* 143*  BUN 6 8  CREATININE 0.67 0.50  CALCIUM 8.9 8.2*   CBC: Recent Labs  Lab 09/22/18 1307 09/23/18 0609  WBC 11.9* 11.6*  NEUTROABS 9.0*  --   HGB 14.2 11.8*  HCT 41.4 36.1  MCV 86.8 90.3  PLT 402* 370   Cardiac Enzymes: Recent Labs  Lab 09/22/18 1617  TROPONINI <0.03   BNP (last 3 results) Recent Labs    09/22/18 1307  BNP 44.0     Recent Results (from the past 240 hour(s))  Culture, blood (routine x 2)     Status: None (Preliminary result)   Collection Time: 09/22/18  1:06 PM  Result Value Ref Range Status   Specimen Description BLOOD RIGHT ANTECUBITAL  Final   Special Requests   Final    BOTTLES DRAWN AEROBIC AND ANAEROBIC Blood Culture adequate volume   Culture  Final    NO GROWTH < 24 HOURS Performed at [facility], [address] [place], [place] [address]    Report Status PENDING  Incomplete  Culture, blood (routine x 2)     Status: None (Preliminary result)   Collection Time: 09/22/18  4:17 PM  Result Value Ref Range Status   Specimen Description BLOOD BLOOD RIGHT HAND  Final   Special Requests   Final    BOTTLES DRAWN AEROBIC AND ANAEROBIC Blood Culture adequate volume   Culture   Final    NO GROWTH < 24 HOURS Performed at [facility], [address] [place], [place] [address]    Report Status PENDING  Incomplete  MRSA PCR Screening     Status:  None   Collection Time: 09/22/18  4:20 PM  Result Value Ref Range Status   MRSA by PCR NEGATIVE NEGATIVE Final    Comment:        The GeneXpert MRSA Assay (FDA approved for NASAL specimens only), is one component of a comprehensive MRSA colonization surveillance program. It is not intended to diagnose MRSA infection nor to guide or monitor treatment for MRSA infections. Performed at [facility], [address] [place], [place] [address]   Expectorated sputum assessment w rflx to resp cult     Status: None   Collection Time: 09/22/18  4:23 PM  Result Value Ref Range Status   Specimen Description SPUTUM  Final   Special Requests Normal  Final   Sputum evaluation   Final    THIS SPECIMEN IS ACCEPTABLE FOR SPUTUM CULTURE Performed at [facility], [address] [place], [place] [address]    Report Status 09/22/2018 FINAL  Final  Culture, respiratory     Status: None (Preliminary result)   Collection Time: 09/22/18  4:23 PM  Result Value Ref Range Status   Specimen Description SPUTUM  Final   Special Requests   Final    Normal Reflexed from [id number] Performed at [facility], [address] [place], [place] [address]    Gram Stain   Final    ABUNDANT WBC PRESENT, PREDOMINANTLY PMN FEW GRAM POSITIVE COCCI FEW GRAM NEGATIVE RODS FEW GRAM POSITIVE RODS    Culture PENDING  Incomplete   Report Status PENDING  Incomplete     Studies: Dg Chest 2 View  Result Date: 09/22/2018 CLINICAL DATA:  Patient with cough and congestion EXAM: CHEST - 2 VIEW COMPARISON:  None. FINDINGS: Monitoring leads overlie the patient. Normal cardiac and mediastinal contours. Coarse interstitial opacities within the right mid and lower lung and left lower lung. No pleural effusion. Regional skeleton is unremarkable. IMPRESSION: Right mid and lower lung and left lung base coarse interstitial opacities may represent pneumonia or atypical infectious process. Given the  appearance of the findings, recommend short-term follow-up chest radiograph to assess for interval improvement/resolution. Electronically Signed   By: Annia Beltrew  Davis M.D.   On: 09/22/2018 13:40   Ct Angio Chest Pe W And/or Wo Contrast  Result Date: 09/22/2018 CLINICAL DATA:  Cough, shortness of breath, hypoxia EXAM: CT ANGIOGRAPHY CHEST WITH CONTRAST TECHNIQUE: Multidetector CT imaging of the chest was performed using the standard protocol during bolus administration of intravenous contrast. Multiplanar CT image reconstructions and MIPs were obtained to evaluate the vascular anatomy. CONTRAST:  75mL ISOVUE-370 IOPAMIDOL (ISOVUE-370) INJECTION 76% COMPARISON:  None. FINDINGS: Cardiovascular: Heart size normal. Trace pericardial fluid. RV is nondilated. Satisfactory opacification of pulmonary arteries noted, and there is no evidence  of pulmonary emboli. Adequate contrast opacification of the thoracic aorta with no evidence of dissection, aneurysm, or stenosis. There is classic 3-vessel brachiocephalic arch anatomy without proximal stenosis. No significant atheromatous change. Mediastinum/Nodes: No hilar or mediastinal adenopathy. Lungs/Pleura: No pleural effusion. No pneumothorax. Extensive scattered poorly marginated small airspace opacities throughout basilar segments of both lower lobes, right middle lobe and lingula. Mild scattered airspace opacities in the upper lobes, right worse than left. There are focal areas of fairly dense consolidation in the lung bases and right middle lobe, with air bronchograms. Upper Abdomen: No acute findings. Musculoskeletal: No chest wall abnormality. No acute or significant osseous findings. Review of the MIP images confirms the above findings. IMPRESSION: 1.   Negative for acute PE or thoracic aortic dissection. 2. Extensive bilateral airspace disease involving bases more than apices suggesting multifocal pneumonia or other infectious process, hypersensitivity or inflammatory  process, less likely atypical edema. Electronically Signed   By: Corlis Leak M.D.   On: 09/22/2018 14:48    Scheduled Meds: . budesonide (PULMICORT) nebulizer solution  0.5 mg Nebulization BID  . enoxaparin (LOVENOX) injection  40 mg Subcutaneous Q24H  . Influenza vac split quadrivalent PF  0.5 mL Intramuscular Tomorrow-1000  . ipratropium-albuterol  3 mL Nebulization Q6H  . methylPREDNISolone (SOLU-MEDROL) injection  40 mg Intravenous Daily   Continuous Infusions: . 0.9 % NaCl with KCl 20 mEq / L 75 mL/hr at 09/23/18 0715  . azithromycin    . cefTRIAXone (ROCEPHIN)  IV      Assessment/Plan:  1. Clinical sepsis with bilateral extensive pneumonia.  Patient on IV Rocephin and Zithromax.  Follow-up blood cultures.  Sputum culture also needs to be followed up.  First procalcitonin slightly elevated and second procalcitonin has normalized.  Respiratory viral panel still pending at this time. 2. Acute hypoxic respiratory failure.  Try to taper off oxygen.  Continue Solu-Medrol daily.  Patient with slight wheeze.  Evaluate again tomorrow 3. Hypokalemia.  This has been replaced 4. Hyponatremia.  This has also normalized 5. Morbid obesity with a BMI of 41.27 6. Impaired fasting glucose.  Hemoglobin A1c still pending  Code Status:     Code Status Orders  (From admission, onward)         Start     Ordered   09/22/18 1439  Full code  Continuous     09/22/18 1439        Code Status History    This patient has a current code status but no historical code status.     Disposition Plan: Evaluate daily on when to go home  Antibiotics:  Rocephin  Zithromax  Time spent: 25 minutes  Marcellina Jonsson [employer]

## 2018-09-23 NOTE — Progress Notes (Signed)
PHARMACIST - PHYSICIAN COMMUNICATION  CONCERNING:  Enoxaparin (Lovenox) for DVT Prophylaxis    RECOMMENDATION: Patient was prescribed enoxaprin 40mg  q24 hours for VTE prophylaxis.   Filed Weights   09/22/18 1259  Weight: 233 lb (105.7 kg)    Body mass index is 41.27 kg/m.  Estimated Creatinine Clearance: 116.4 mL/min (by C-G formula based on SCr of 0.5 mg/dL).   Based on [facility] policy patient is candidate for enoxaparin 40mg  every 12 hour dosing due to BMI being >40.   DESCRIPTION: Pharmacy has adjusted enoxaparin dose per [facility] policy.  Patient is now receiving enoxaparin 40mg  every 12 hours.   Carola FrostNathan A Tawan Degroote, PharmD Clinical Pharmacist  09/23/2018 11:35 AM

## 2018-09-24 LAB — HIV ANTIBODY (ROUTINE TESTING W REFLEX): HIV Screen 4th Generation wRfx: NONREACTIVE

## 2018-09-24 LAB — PROCALCITONIN: Procalcitonin: 0.1 ng/mL

## 2018-09-24 MED ORDER — LEVOFLOXACIN 750 MG PO TABS: 750.0000 mg | ORAL_TABLET | Freq: Every day | ORAL | 0 refills | Status: AC

## 2018-09-24 MED ORDER — GUAIFENESIN-DM 100-10 MG/5ML PO SYRP: 5.0000 mL | ORAL_SOLUTION | ORAL | 0 refills | Status: DC | PRN

## 2018-09-24 MED ORDER — PREDNISONE 10 MG PO TABS: ORAL_TABLET | ORAL | 0 refills | Status: DC

## 2018-09-24 NOTE — Plan of Care (Signed)
Pt care plan adequate for discharge. Pt refused wheelchair transport to vehicle upon discharge. All questions addressed at time of discharge.

## 2018-09-24 NOTE — Discharge Summary (Signed)
[facility] -  at [facility]   PATIENT NAME: Brenda Phillips    MR#:  [id number]  DATE OF BIRTH:  14-Nov-1985  DATE OF ADMISSION:  09/22/2018 ADMITTING PHYSICIAN: Alford Highlandichard Wieting, MD  DATE OF DISCHARGE: 09/24/2018  PRIMARY CARE PHYSICIAN: [facility]    ADMISSION DIAGNOSIS:  Hypoxia [R09.02] Community acquired pneumonia, unspecified laterality [J18.9]  DISCHARGE DIAGNOSIS:  Active Problems:   Sepsis (HCC)   SECONDARY DIAGNOSIS:   Past Medical History:  Diagnosis Date  . Medical history non-contributory     HOSPITAL COURSE:   1. Clinical sepsis with bilateral extensive pneumonia.  Patient was treated with IV ceftriaxone, Zithromax.  She has clinically improved, her respiratory panel was negative, blood cultures remain negative.  She is now afebrile and hemodynamically stable.  She is being discharged on oral prednisone and Levaquin for a few days.   2. Acute hypoxic respiratory failure.    Due to pneumonia.  Patient was treated with IV ceftriaxone, Zithromax, given some IV steroids and duo nebs due to her bronchospasm.  She has improved.  She is no longer hypoxic.  She is being discharged on oral prednisone taper and Levaquin as mentioned below.   3. Hypokalemia.  This has been replaced and resolved now. 4. Hyponatremia.  This has also normalized with IV fluids  Stable to be discharged Home.   DISCHARGE CONDITIONS:   Stable.   CONSULTS OBTAINED:    DRUG ALLERGIES:   Allergies  Allergen Reactions  . Sulfa Antibiotics   . Yellow Dyes (Non-Tartrazine)     DISCHARGE MEDICATIONS:   Allergies as of 09/24/2018      Reactions   Sulfa Antibiotics    Yellow Dyes (non-tartrazine)       Medication List    TAKE these medications   guaiFENesin-dextromethorphan 100-10 MG/5ML syrup Commonly known as:  ROBITUSSIN DM Take 5 mLs by mouth every 4 (four) hours as needed for cough (chest congestion).   levofloxacin 750 MG  tablet Commonly known as:  LEVAQUIN Take 1 tablet (750 mg total) by mouth daily for 6 days.   predniSONE 10 MG tablet Commonly known as:  DELTASONE Label  & dispense according to the schedule below. 5 Pills PO for 1 day then, 4 Pills PO for 1 day, 3 Pills PO for 1 day, 2 Pills PO for 1 day, 1 Pill PO for 1 days then STOP. Notes to patient:  Take as ordered    SPRINTEC 28 0.25-35 MG-MCG tablet Generic drug:  norgestimate-ethinyl estradiol Take 1 tablet by mouth daily.         DISCHARGE INSTRUCTIONS:   DIET:  Regular diet  DISCHARGE CONDITION:  Stable  ACTIVITY:  Activity as tolerated  OXYGEN:  Home Oxygen: No.   Oxygen Delivery: room air  DISCHARGE LOCATION:  home   If you experience worsening of your admission symptoms, develop shortness of breath, life threatening emergency, suicidal or homicidal thoughts you must seek medical attention immediately by calling 911 or calling your MD immediately  if symptoms less severe.  You Must read complete instructions/literature along with all the possible adverse reactions/side effects for all the Medicines you take and that have been prescribed to you. Take any new Medicines after you have completely understood and accpet all the possible adverse reactions/side effects.   Please note  You were cared for by a hospitalist during your hospital stay. If you have any questions about your discharge medications or the care you received while you were  in the hospital after you are discharged, you can call the unit and asked to speak with the hospitalist on call if the hospitalist that took care of you is not available. Once you are discharged, your primary care physician will handle any further medical issues. Please note that NO REFILLS for any discharge medications will be authorized once you are discharged, as it is imperative that you return to your primary care physician (or establish a relationship with a primary care physician if you  do not have one) for your aftercare needs so that they can reassess your need for medications and monitor your lab values.     Today   No wheezing, bronchospasm.  Afebrile and hemodynamically stable.  Respiratory panel is negative, cultures remain negative.  Will discharge home today on oral antibiotics and prednisone taper.  VITAL SIGNS:  Blood pressure 128/81, pulse 98, temperature 97.8 F (36.6 C), temperature source Oral, resp. rate 12, height 5\' 3"  (1.6 m), weight 105.7 kg, last menstrual period 09/13/2018, SpO2 98 %.  I/O:    Intake/Output Summary (Last 24 hours) at 09/24/2018 1350 Last data filed at 09/24/2018 0049 Gross per 24 hour  Intake 1862.88 ml  Output -  Net 1862.88 ml    PHYSICAL EXAMINATION:  GENERAL:  32 y.o.-year-old patient lying in the bed with no acute distress.  EYES: Pupils equal, round, reactive to light and accommodation. No scleral icterus. Extraocular muscles intact.  HEENT: Head atraumatic, normocephalic. Oropharynx and nasopharynx clear.  NECK:  Supple, no jugular venous distention. No thyroid enlargement, no tenderness.  LUNGS: Normal breath sounds bilaterally, no wheezing, rales,rhonchi. No use of accessory muscles of respiration.  CARDIOVASCULAR: S1, S2 normal. No murmurs, rubs, or gallops.  ABDOMEN: Soft, non-tender, non-distended. Bowel sounds present. No organomegaly or mass.  EXTREMITIES: No pedal edema, cyanosis, or clubbing.  NEUROLOGIC: Cranial nerves II through XII are intact. No focal motor or sensory defecits b/l.  PSYCHIATRIC: The patient is alert and oriented x 3.  SKIN: No obvious rash, lesion, or ulcer.   DATA REVIEW:   CBC Recent Labs  Lab 09/23/18 0609  WBC 11.6*  HGB 11.8*  HCT 36.1  PLT 370    Chemistries  Recent Labs  Lab 09/23/18 0609  NA 137  K 3.9  CL 104  CO2 26  GLUCOSE 143*  BUN 8  CREATININE 0.50  CALCIUM 8.2*    Cardiac Enzymes Recent Labs  Lab 09/22/18 1617  TROPONINI <0.03     Microbiology Results  Results for orders placed or performed during the hospital encounter of 09/22/18  Culture, blood (routine x 2)     Status: None (Preliminary result)   Collection Time: 09/22/18  1:06 PM  Result Value Ref Range Status   Specimen Description BLOOD RIGHT ANTECUBITAL  Final   Special Requests   Final    BOTTLES DRAWN AEROBIC AND ANAEROBIC Blood Culture adequate volume   Culture   Final    NO GROWTH 2 DAYS Performed at [facility] [address], [place], [place] [address]    Report Status PENDING  Incomplete  Respiratory Panel by PCR     Status: Abnormal   Collection Time: 09/22/18  3:00 PM  Result Value Ref Range Status   Adenovirus NOT DETECTED NOT DETECTED Final   Coronavirus 229E NOT DETECTED NOT DETECTED Final   Coronavirus HKU1 NOT DETECTED NOT DETECTED Final   Coronavirus NL63 NOT DETECTED NOT DETECTED Final   Coronavirus OC43 NOT DETECTED NOT DETECTED Final  Metapneumovirus NOT DETECTED NOT DETECTED Final   Rhinovirus / Enterovirus NOT DETECTED NOT DETECTED Final   Influenza A NOT DETECTED NOT DETECTED Final   Influenza B NOT DETECTED NOT DETECTED Final   Parainfluenza Virus 1 NOT DETECTED NOT DETECTED Final   Parainfluenza Virus 2 NOT DETECTED NOT DETECTED Final   Parainfluenza Virus 3 NOT DETECTED NOT DETECTED Final   Parainfluenza Virus 4 NOT DETECTED NOT DETECTED Final   Respiratory Syncytial Virus DETECTED (A) NOT DETECTED Final    Comment: CRITICAL RESULT CALLED TO, READ BACK BY AND VERIFIED WITH:  RN Tomma LightningWHITHERSPOON, M 1112 [phone] FCP    Bordetella pertussis NOT DETECTED NOT DETECTED Final   Chlamydophila pneumoniae NOT DETECTED NOT DETECTED Final   Mycoplasma pneumoniae NOT DETECTED NOT DETECTED Final  Culture, blood (routine x 2)     Status: None (Preliminary result)   Collection Time: 09/22/18  4:17 PM  Result Value Ref Range Status   Specimen Description BLOOD BLOOD RIGHT HAND  Final   Special Requests   Final    BOTTLES  DRAWN AEROBIC AND ANAEROBIC Blood Culture adequate volume   Culture   Final    NO GROWTH 2 DAYS Performed at [facility], [address] [place], [place] [address]    Report Status PENDING  Incomplete  MRSA PCR Screening     Status: None   Collection Time: 09/22/18  4:20 PM  Result Value Ref Range Status   MRSA by PCR NEGATIVE NEGATIVE Final    Comment:        The GeneXpert MRSA Assay (FDA approved for NASAL specimens only), is one component of a comprehensive MRSA colonization surveillance program. It is not intended to diagnose MRSA infection nor to guide or monitor treatment for MRSA infections. Performed at [facility], [address] [place], [place] [address]   Expectorated sputum assessment w rflx to resp cult     Status: None   Collection Time: 09/22/18  4:23 PM  Result Value Ref Range Status   Specimen Description SPUTUM  Final   Special Requests Normal  Final   Sputum evaluation   Final    THIS SPECIMEN IS ACCEPTABLE FOR SPUTUM CULTURE Performed at [facility], [address] [place], [place] [address]    Report Status 09/22/2018 FINAL  Final  Culture, respiratory     Status: None (Preliminary result)   Collection Time: 09/22/18  4:23 PM  Result Value Ref Range Status   Specimen Description SPUTUM  Final   Special Requests   Final    Normal Reflexed from [id number] Performed at [facility], [address] [place], [place] [address]    Gram Stain   Final    ABUNDANT WBC PRESENT, PREDOMINANTLY PMN FEW GRAM POSITIVE COCCI FEW GRAM NEGATIVE RODS FEW GRAM POSITIVE RODS    Culture FEW Consistent with normal respiratory flora.  Final   Report Status PENDING  Incomplete    RADIOLOGY:  Ct Angio Chest Pe W And/or Wo Contrast  Result Date: 09/22/2018 CLINICAL DATA:  Cough, shortness of breath, hypoxia EXAM: CT ANGIOGRAPHY CHEST WITH CONTRAST TECHNIQUE: Multidetector CT imaging of the chest was performed using the  standard protocol during bolus administration of intravenous contrast. Multiplanar CT image reconstructions and MIPs were obtained to evaluate the vascular anatomy. CONTRAST:  75mL ISOVUE-370 IOPAMIDOL (ISOVUE-370) INJECTION 76% COMPARISON:  None. FINDINGS: Cardiovascular: Heart size normal. Trace pericardial fluid. RV is nondilated. Satisfactory opacification of pulmonary arteries noted, and there is no evidence  of pulmonary emboli. Adequate contrast opacification of the thoracic aorta with no evidence of dissection, aneurysm, or stenosis. There is classic 3-vessel brachiocephalic arch anatomy without proximal stenosis. No significant atheromatous change. Mediastinum/Nodes: No hilar or mediastinal adenopathy. Lungs/Pleura: No pleural effusion. No pneumothorax. Extensive scattered poorly marginated small airspace opacities throughout basilar segments of both lower lobes, right middle lobe and lingula. Mild scattered airspace opacities in the upper lobes, right worse than left. There are focal areas of fairly dense consolidation in the lung bases and right middle lobe, with air bronchograms. Upper Abdomen: No acute findings. Musculoskeletal: No chest wall abnormality. No acute or significant osseous findings. Review of the MIP images confirms the above findings. IMPRESSION: 1.   Negative for acute PE or thoracic aortic dissection. 2. Extensive bilateral airspace disease involving bases more than apices suggesting multifocal pneumonia or other infectious process, hypersensitivity or inflammatory process, less likely atypical edema. Electronically Signed   By: Corlis Leak M.D.   On: 09/22/2018 14:48      Management plans discussed with the patient, family and they are in agreement.  CODE STATUS:     Code Status Orders  (From admission, onward)         Start     Ordered   09/22/18 1439  Full code  Continuous     09/22/18 1439        Code Status History    This patient has a current code status but  no historical code status.      TOTAL TIME TAKING CARE OF THIS PATIENT: 40 minutes.    Houston Siren M.D on 09/24/2018 at 1:50 PM  Between 7am to 6pm - Pager - [phone]  After 6pm go to [email or web address] - Therapist, [facility] Hospitalists  Office  [phone]  CC: Primary care physician; [facility]

## 2018-09-25 LAB — CULTURE, RESPIRATORY W GRAM STAIN
Culture: NORMAL
Special Requests: NORMAL

## 2018-09-27 LAB — CULTURE, BLOOD (ROUTINE X 2)
Culture: NO GROWTH
Culture: NO GROWTH
Special Requests: ADEQUATE
Special Requests: ADEQUATE

## 2020-04-20 ENCOUNTER — Other Ambulatory Visit: Payer: Self-pay

## 2020-04-20 ENCOUNTER — Emergency Department
Admission: EM | Admit: 2020-04-20 | Discharge: 2020-04-20 | Disposition: A | Discharge status: Home / Self Care | Payer: Medicaid Other | Attending: Emergency Medicine | Admitting: Emergency Medicine

## 2020-04-20 ENCOUNTER — Emergency Department: Payer: Medicaid Other

## 2020-04-20 ENCOUNTER — Encounter: Payer: Self-pay | Admitting: Emergency Medicine

## 2020-04-20 DIAGNOSIS — R52 Pain, unspecified: Secondary | ICD-10-CM

## 2020-04-20 DIAGNOSIS — Z87891 Personal history of nicotine dependence: Secondary | ICD-10-CM | POA: Insufficient documentation

## 2020-04-20 DIAGNOSIS — K429 Umbilical hernia without obstruction or gangrene: Secondary | ICD-10-CM

## 2020-04-20 DIAGNOSIS — K469 Unspecified abdominal hernia without obstruction or gangrene: Secondary | ICD-10-CM

## 2020-04-20 DIAGNOSIS — R1907 Generalized intra-abdominal and pelvic swelling, mass and lump: Secondary | ICD-10-CM | POA: Insufficient documentation

## 2020-04-20 LAB — CBC
HCT: 43.7 % (ref 36.0–46.0)
Hemoglobin: 15.4 g/dL — ABNORMAL HIGH (ref 12.0–15.0)
MCH: 31.4 pg (ref 26.0–34.0)
MCHC: 35.2 g/dL (ref 30.0–36.0)
MCV: 89 fL (ref 80.0–100.0)
Platelets: 307 10*3/uL (ref 150–400)
RBC: 4.91 MIL/uL (ref 3.87–5.11)
RDW: 12.5 % (ref 11.5–15.5)
WBC: 10.6 10*3/uL — ABNORMAL HIGH (ref 4.0–10.5)
nRBC: 0 % (ref 0.0–0.2)

## 2020-04-20 LAB — BASIC METABOLIC PANEL
Anion gap: 8 (ref 5–15)
BUN: 7 mg/dL (ref 6–20)
CO2: 28 mmol/L (ref 22–32)
Calcium: 9.1 mg/dL (ref 8.9–10.3)
Chloride: 103 mmol/L (ref 98–111)
Creatinine, Ser: 0.75 mg/dL (ref 0.44–1.00)
GFR calc Af Amer: >60 mL/min (ref >60)
GFR calc non Af Amer: >60 mL/min (ref >60)
Glucose, Bld: 90 mg/dL (ref 70–99)
Potassium: 4.1 mmol/L (ref 3.5–5.1)
Sodium: 139 mmol/L (ref 135–145)

## 2020-04-20 LAB — POCT PREGNANCY, URINE: Preg Test, Ur: NEGATIVE

## 2020-04-20 LAB — LACTIC ACID, PLASMA: Lactic Acid, Venous: 1.1 mmol/L (ref 0.5–1.9)

## 2020-04-20 LAB — PREGNANCY, URINE: Preg Test, Ur: NEGATIVE

## 2020-04-20 MED ORDER — SENNOSIDES-DOCUSATE SODIUM 8.6-50 MG PO TABS: 2.0000 | ORAL_TABLET | Freq: Two times a day (BID) | ORAL | 0 refills | Status: DC

## 2020-04-20 NOTE — ED Provider Notes (Signed)
[facility] Emergency Department Provider Note  ____________________________________________  Time seen: Approximately 7:12 PM  I have reviewed the triage vital signs and the nursing notes.   HISTORY  Chief Complaint GI Problem    HPI Brenda Phillips is a 34 y.o. female with no significant past medical history who comes the ED complaining of decreased appetite for the past few days and noticing a bulge in the center of her abdomen that gets bigger with coughing or bending over or straining with constipation.  Denies vomiting.  Has had decreased bowel movements for the past 3 days.  No fevers or chills.  No sudden severe pain, but bulges associated with some nonlocalized mild discomfort..    Past Medical History:  Diagnosis Date  . Medical history non-contributory      Patient Active Problem List   Diagnosis Date Noted  . Sepsis (HCC) 09/22/2018     Past Surgical History:  Procedure Laterality Date  . CESAREAN SECTION     x 2     Prior to Admission medications   Medication Sig Start Date End Date Taking? Authorizing Provider  guaiFENesin-dextromethorphan (ROBITUSSIN DM) 100-10 MG/5ML syrup Take 5 mLs by mouth every 4 (four) hours as needed for cough (chest congestion). 09/24/18   Houston Siren, MD  norgestimate-ethinyl estradiol (SPRINTEC 28) 0.25-35 MG-MCG tablet Take 1 tablet by mouth daily.    [provider]  predniSONE (DELTASONE) 10 MG tablet Label  & dispense according to the schedule below. 5 Pills PO for 1 day then, 4 Pills PO for 1 day, 3 Pills PO for 1 day, 2 Pills PO for 1 day, 1 Pill PO for 1 days then STOP. 09/24/18   Houston Siren, MD  senna-docusate (SENOKOT-S) 8.6-50 MG tablet Take 2 tablets by mouth 2 (two) times daily. 04/20/20   Sharman Cheek, MD     Allergies Sulfa antibiotics and Yellow dyes (non-tartrazine)   Family History  Problem Relation Age of Onset  . Diabetes Mother   . Liver disease Father      Social History Social History   Tobacco Use  . Smoking status: Former Games developer  . Smokeless tobacco: Never Used  Vaping Use  . Vaping Use: Former  Substance Use Topics  . Alcohol use: Not Currently  . Drug use: Not Currently    Review of Systems  Constitutional:   No fever or chills.  ENT:   No sore throat. No rhinorrhea. Cardiovascular:   No chest pain or syncope. Respiratory:   No dyspnea or cough. Gastrointestinal:   Positive as above for abdominal pain without vomiting and diarrhea.  Musculoskeletal:   Negative for focal pain or swelling All other systems reviewed and are negative except as documented above in ROS and HPI.  ____________________________________________   PHYSICAL EXAM:  VITAL SIGNS: ED Triage Vitals  Enc Vitals Group     BP 04/20/20 1716 (!) 132/77     Pulse Rate 04/20/20 1716 64     Resp 04/20/20 1716 18     Temp 04/20/20 1716 99.8 F (37.7 C)     Temp Source 04/20/20 1716 Oral     SpO2 04/20/20 1716 100 %     Weight 04/20/20 1621 (!) 235 lb (106.6 kg)     Height 04/20/20 1621 5\' 4"  (1.626 m)     Head Circumference --      Peak Flow --      Pain Score 04/20/20 1621 0     Pain Loc --  Pain Edu? --      Excl. in GC? --     Vital signs reviewed, nursing assessments reviewed.   Constitutional:   Alert and oriented. Non-toxic appearance. Eyes:   Conjunctivae are normal. EOMI. PERRL. ENT      Head:   Normocephalic and atraumatic.      Nose:   Wearing a mask.      Mouth/Throat:   Wearing a mask.      Neck:   No meningismus. Full ROM. Hematological/Lymphatic/Immunilogical:   No inguinal lymphadenopathy. Cardiovascular:   RRR. Symmetric bilateral radial and DP pulses.  No murmurs. Cap refill less than 2 seconds. Respiratory:   Normal respiratory effort without tachypnea/retractions. Breath sounds are clear and equal bilaterally. No wheezes/rales/rhonchi. Gastrointestinal:   Soft without tenderness to deep palpation.  There is a 1 cm  umbilical hernia with a small amount of tissue entrapped.  This was reduced with manipulation at the bedside.  Symptoms improved afterward.. Non distended. There is no CVA tenderness.  No rebound, rigidity, or guarding. Musculoskeletal:   Normal range of motion in all extremities. No joint effusions.  No lower extremity tenderness.  No edema. Neurologic:   Normal speech and language.  Motor grossly intact. No acute focal neurologic deficits are appreciated.  Skin:    Skin is warm, dry and intact. No rash noted.  No petechiae, purpura, or bullae.  ____________________________________________    LABS (pertinent positives/negatives) (all labs ordered are listed, but only abnormal results are displayed) Labs Reviewed  CBC - Abnormal; Notable for the following components:      Result Value   WBC 10.6 (*)    Hemoglobin 15.4 (*)    All other components within normal limits  BASIC METABOLIC PANEL  LACTIC ACID, PLASMA  PREGNANCY, URINE  LACTIC ACID, PLASMA   ____________________________________________   EKG    ____________________________________________    RADIOLOGY  DG Abd 2 Views  Result Date: 04/20/2020 CLINICAL DATA:  Dyspepsia, abdominal pain EXAM: ABDOMEN - 2 VIEW COMPARISON:  None. FINDINGS: The bowel gas pattern is normal. There is no evidence of free air. No radio-opaque calculi or other significant radiographic abnormality is seen. IMPRESSION: Negative. Electronically Signed   By: Helyn Numbers MD   On: 04/20/2020 19:28    ____________________________________________   PROCEDURES Procedures  ____________________________________________    CLINICAL IMPRESSION / ASSESSMENT AND PLAN / ED COURSE  Medications ordered in the ED: Medications - No data to display  Pertinent labs & imaging results that were available during my care of the patient were reviewed by me and considered in my medical decision making (see chart for details).  Brenda Phillips was  evaluated in Emergency Department on 04/20/2020 for the symptoms described in the history of present illness. She was evaluated in the context of the global COVID-19 pandemic, which necessitated consideration that the patient might be at risk for infection with the SARS-CoV-2 virus that causes COVID-19. Institutional protocols and algorithms that pertain to the evaluation of patients at risk for COVID-19 are in a state of rapid change based on information released by regulatory bodies including the CDC and federal and state organizations. These policies and algorithms were followed during the patient's care in the ED.   Patient presents with abdominal pain and a bulge noted at home.  On exam she has an apparent umbilical hernia which is small, likely fat-containing, reducible at bedside.  No incarceration or strangulation.  Abdomen is not distended, doubt small bowel obstruction.  Will obtain  a 2 view x-ray to look for signs of bowel obstruction, if reassuring patient can be discharged home to follow-up with general surgery clinic.  Labs are normal.  Pregnancy test negative, vital signs normal.   Considering the patient's symptoms, medical history, and physical examination today, I have low suspicion for cholecystitis or biliary pathology, pancreatitis, perforation or bowel obstruction, hernia, intra-abdominal abscess, AAA or dissection, volvulus or intussusception, mesenteric ischemia, or appendicitis.  Doubt STI PID TOA or torsion  Patient has not been vaccinated against Covid.  She is agreeable to obtaining a vaccine.  Advised her on options for obtaining vaccine at the time  of her choosing and encouraged her to do so..  ----------------------------------------- 7:53 PM on 04/20/2020 -----------------------------------------  Abdominal x-ray negative, no signs of obstruction.      ____________________________________________   FINAL CLINICAL IMPRESSION(S) / ED DIAGNOSES    Final diagnoses:   Umbilical hernia without obstruction and without gangrene  Morbid obesity [facility])     ED Discharge Orders         Ordered    senna-docusate (SENOKOT-S) 8.6-50 MG tablet  2 times daily     Discontinue  Reprint     04/20/20 1912          Portions of this note were generated with dragon dictation software. Dictation errors may occur despite best attempts at proofreading.   Sharman Cheek, MD 04/20/20 [phone]

## 2020-04-20 NOTE — ED Triage Notes (Signed)
Pt reports has had problems with digestion for a few days and her husband noticed a knot of sorts in her abd that is more pronounced when she coughs. Pt reports unsure of what it is and wanted to be checked out. Pt also with constipation. States small BM when they do move, last real good one was 2-3 days ago.

## 2020-05-05 ENCOUNTER — Ambulatory Visit (INDEPENDENT_AMBULATORY_CARE_PROVIDER_SITE_OTHER): Payer: Self-pay | Admitting: Surgery

## 2020-05-05 ENCOUNTER — Ambulatory Visit: Payer: Self-pay | Admitting: Surgery

## 2020-05-05 ENCOUNTER — Encounter: Payer: Self-pay | Admitting: Surgery

## 2020-05-05 ENCOUNTER — Other Ambulatory Visit: Payer: Self-pay

## 2020-05-05 VITALS — BP 124/78 | HR 75 | Temp 98.8°F | Ht 64.0 in | Wt 192.0 lb

## 2020-05-05 DIAGNOSIS — E669 Obesity, unspecified: Secondary | ICD-10-CM

## 2020-05-05 DIAGNOSIS — K439 Ventral hernia without obstruction or gangrene: Secondary | ICD-10-CM

## 2020-05-05 DIAGNOSIS — Z6832 Body mass index (BMI) 32.0-32.9, adult: Secondary | ICD-10-CM

## 2020-05-05 NOTE — Progress Notes (Signed)
Patient ID: Brenda Phillips, female   DOB: Jun 18, 1986, 34 y.o.   MRN: [id number]  Chief Complaint: Umbilical hernia  History of Present Illness Brenda Phillips is a 34 y.o. female with a long known history of an irregular appearance of her umbilical area.  She has been pregnant twice with 2 C-sections, both completed with Pfannenstiel incisions.  Has recently lost some weight and noticed increased bulge present at the cephalad aspect of her umbilicus.  She was seen in the ED for evaluation where it was readily reduced, she denied having much in the way of pain it just concerned her sufficiently to warrant the ED visit.  She reports she does not utilize her abdominal musculature much, likely due to some degree of diastases recti that she had developed/worsened with her pregnancy.  And overall she does not do a significant amount of heavy lifting, and has had no chronic symptoms like cough or sneezing that is exacerbated any periumbilical pain.  She does admit to a history of nausea, she has had constipation and she is a current smoker.  Past Medical History Past Medical History:  Diagnosis Date  . Medical history non-contributory       Past Surgical History:  Procedure Laterality Date  . CESAREAN SECTION     x 2    Allergies  Allergen Reactions  . Sulfa Antibiotics   . Yellow Dyes (Non-Tartrazine)     Current Outpatient Medications  Medication Sig Dispense Refill  . norgestimate-ethinyl estradiol (SPRINTEC 28) 0.25-35 MG-MCG tablet Take 1 tablet by mouth daily.    . Sennosides (SENNA LAX PO) Take by mouth.     No current facility-administered medications for this visit.    Family History Family History  Problem Relation Age of Onset  . Diabetes Mother   . Liver disease Father       Social History Social History   Tobacco Use  . Smoking status: Current Some Day Smoker    Packs/day: 0.15    Types: Cigarettes  . Smokeless tobacco: Never Used  Vaping Use  . Vaping  Use: Former  Substance Use Topics  . Alcohol use: Not Currently  . Drug use: Not Currently        Review of Systems  Constitutional: Negative.   HENT: Negative.   Eyes: Negative.   Respiratory: Negative.   Cardiovascular: Negative.   Gastrointestinal: Negative for blood in stool, diarrhea, heartburn, melena and vomiting.  Genitourinary: Negative.   Skin: Negative.   Neurological: Negative.   Psychiatric/Behavioral: Negative.       Physical Exam Blood pressure 124/78, pulse 75, temperature 98.8 F (37.1 C), height 5\' 4"  (1.626 m), weight 192 lb (87.1 kg), last menstrual period 04/18/2020, SpO2 98 %. Last Weight  Most recent update: 05/05/2020 11:24 AM   Weight  87.1 kg (192 lb)            CONSTITUTIONAL: Well developed, and nourished, appropriately responsive and aware without distress.   EYES: Sclera non-icteric.   EARS, NOSE, MOUTH AND THROAT: Mask worn.   Hearing is intact to voice.  NECK: Trachea is midline, and there is no jugular venous distension.  LYMPH NODES:  Lymph nodes in the neck are not enlarged. RESPIRATORY:  Lungs are clear, and breath sounds are equal bilaterally. Normal respiratory effort without pathologic use of accessory muscles. CARDIOVASCULAR: Heart is regular in rate and rhythm. GI: The abdomen is  soft, nontender, and nondistended. There were no palpable masses.   She has readily notable  diastases recti, with a prominent bulge on the cephalad aspect of her umbilicus.  It is difficult to fully appreciate the size of the periumbilical defect, due to the degree of diastases present immediately adjacent to it.  She does not reproduce the hernia bulge but it is readily apparent grossly.  I did not appreciate hepatosplenomegaly. There were normal bowel sounds. MUSCULOSKELETAL:  Symmetrical muscle tone appreciated in all four extremities.    SKIN: Skin turgor is normal. No pathologic skin lesions appreciated.  NEUROLOGIC:  Motor and sensation appear grossly  normal.  Cranial nerves are grossly without defect. PSYCH:  Alert and oriented to person, place and time. Affect is appropriate for situation.  Data Reviewed I have personally reviewed what is currently available of the patient's imaging, recent labs and medical records.   Labs:  CBC Latest Ref Rng & Units 04/20/2020 09/23/2018 09/22/2018  WBC 4.0 - 10.5 K/uL 10.6(H) 11.6(H) 11.9(H)  Hemoglobin 12.0 - 15.0 g/dL 15.4(H) 11.8(L) 14.2  Hematocrit 36 - 46 % 43.7 36.1 41.4  Platelets 150 - 400 K/uL 307 370 402(H)   CMP Latest Ref Rng & Units 04/20/2020 09/23/2018 09/22/2018  Glucose 70 - 99 mg/dL 90 106(Y) 694(W)  BUN 6 - 20 mg/dL 7 8 6   Creatinine 0.44 - 1.00 mg/dL 5.46 2.70  Sodium 135 - 145 mmol/L 139 137 134(L)  Potassium 3.5 - 5.1 mmol/L 4.1 3.9 2.9(L)  Chloride 98 - 111 mmol/L 103 104 93(L)  CO2 22 - 32 mmol/L 28 26 28   Calcium 8.9 - 10.3 mg/dL 9.1 3.50) 8.9      Imaging:  Within last 24 hrs: No results found.  Assessment    Diastases recti, periumbilical ventral hernia. Patient Active Problem List   Diagnosis Date Noted  . Sepsis (HCC) 09/22/2018    Plan    Robotic ventral hernia repair with mesh.  Anticipating a degree of plication of her linea alba.  We discussed the difficult transition between plicated linea alba and the persistent epigastric diastases.  We all are in agreement not to pursue any type of component separation to avoid this transition.  We did discuss other options in terms of primary suture repair or direct open hernia repair with placement of mesh.  I feel more comfortable with a intraperitoneal placement of mesh to reinforce the primary closure.  I believe this would be best completed robotically. We discussed risks of mesh, risks of recurrence, risks of complications involving the mesh including infection/transition/adhesions bowel obstruction etc. I discussed possibility of incarceration, strangulation, enlargement in size over time, and the risk  of emergency surgery in the face of strangulation.  Also discussed the risk of surgery including recurrence which can be up to 30% in the case of complex hernias, use of prosthetic materials (mesh) and the increased risk of infxn, post-op infxn and the possible need for re-operation and removal of mesh if used, possibility of post-op SBO or ileus, and the risks of general anesthetic including MI, CVA, sudden death or even reaction to anesthetic medications. The patient and family understands the risks, any and all questions were answered to the patient's satisfaction.  We also discussed smoking cessation, that I will obtain a cotinine prior to her surgery and my strong advice that she have at least 2 full weeks of cessation prior to her general anesthetic.  She believes that will not be an issue, though she feels she may likely resume smoking after her surgery is past. Face-to-face time spent with the patient  and accompanying care providers(if present) was 30 minutes, with more than 50% of the time spent counseling, educating, and coordinating care of the patient.      Campbell Lerner M.D., FACS 05/05/2020, 1:06 PM

## 2020-05-05 NOTE — Patient Instructions (Addendum)
Try to quit smoking prior to surgery.  You have requested for your Umbilical Hernia be repaired. This will be done at [facility] by Dr Claudine Mouton. Please see your (blue)pre-care sheet for information. Our surgery scheduler will be in contact with you to schedule your surgery and go over information.   You will need to arrange to be off work for 1-2 weeks but will have to have a lifting restriction of no more than 15-20 lbs for 6 weeks following your surgery.   Umbilical Hernia, Adult A hernia is a bulge of tissue that pushes through an opening between muscles. An umbilical hernia happens in the abdomen, near the belly button (umbilicus). The hernia may contain tissues from the small intestine, large intestine, or fatty tissue covering the intestines (omentum). Umbilical hernias in adults tend to get worse over time, and they require surgical treatment. There are several types of umbilical hernias. You may have:  A hernia located just above or below the umbilicus (indirect hernia). This is the most common type of umbilical hernia in adults.  A hernia that forms through an opening formed by the umbilicus (direct hernia).  A hernia that comes and goes (reducible hernia). A reducible hernia may be visible only when you strain, lift something heavy, or cough. This type of hernia can be pushed back into the abdomen (reduced).  A hernia that traps abdominal tissue inside the hernia (incarcerated hernia). This type of hernia cannot be reduced.  A hernia that cuts off blood flow to the tissues inside the hernia (strangulated hernia). The tissues can start to die if this happens. This type of hernia requires emergency treatment.  What are the causes? An umbilical hernia happens when tissue inside the abdomen presses on a weak area of the abdominal muscles. What increases the risk? You may have a greater risk of this condition if you:  Are obese.  Have had several pregnancies.  Have a buildup of fluid  inside your abdomen (ascites).  Have had surgery that weakens the abdominal muscles.  What are the signs or symptoms? The main symptom of this condition is a painless bulge at or near the belly button. A reducible hernia may be visible only when you strain, lift something heavy, or cough. Other symptoms may include:  Dull pain.  A feeling of pressure.  Symptoms of a strangulated hernia may include:  Pain that gets increasingly worse.  Nausea and vomiting.  Pain when pressing on the hernia.  Skin over the hernia becoming red or purple.  Constipation.  Blood in the stool.  How is this diagnosed? This condition may be diagnosed based on:  A physical exam. You may be asked to cough or strain while standing. These actions increase the pressure inside your abdomen and force the hernia through the opening in your muscles. Your health care provider may try to reduce the hernia by pressing on it.  Your symptoms and medical history.  How is this treated? Surgery is the only treatment for an umbilical hernia. Surgery for a strangulated hernia is done as soon as possible. If you have a small hernia that is not incarcerated, you may need to lose weight before having surgery. Follow these instructions at home:  Lose weight, if told by your health care provider.  Do not try to push the hernia back in.  Watch your hernia for any changes in color or size. Tell your health care provider if any changes occur.  You may need to avoid activities that  increase pressure on your hernia.  Do not lift anything that is heavier than 10 lb (4.5 kg) until your health care provider says that this is safe.  Take over-the-counter and prescription medicines only as told by your health care provider.  Keep all follow-up visits as told by your health care provider. This is important. Contact a health care provider if:  Your hernia gets larger.  Your hernia becomes painful. Get help right away  if:  You develop sudden, severe pain near the area of your hernia.  You have pain as well as nausea or vomiting.  You have pain and the skin over your hernia changes color.  You develop a fever. This information is not intended to replace advice given to you by your health care provider. Make sure you discuss any questions you have with your health care provider. Document Released: 02/12/2016 Document Revised: 05/15/2016 Document Reviewed: 02/12/2016 Elsevier Interactive Patient Education  [employer].

## 2020-05-05 NOTE — H&P (View-Only) (Signed)
Patient ID: Brenda Phillips, female   DOB: 04/21/1986, 34 y.o.   MRN: [id number]  Chief Complaint: Umbilical hernia  History of Present Illness Brenda Phillips is a 34 y.o. female with a long known history of an irregular appearance of her umbilical area.  She has been pregnant twice with 2 C-sections, both completed with Pfannenstiel incisions.  Has recently lost some weight and noticed increased bulge present at the cephalad aspect of her umbilicus.  She was seen in the ED for evaluation where it was readily reduced, she denied having much in the way of pain it just concerned her sufficiently to warrant the ED visit.  She reports she does not utilize her abdominal musculature much, likely due to some degree of diastases recti that she had developed/worsened with her pregnancy.  And overall she does not do a significant amount of heavy lifting, and has had no chronic symptoms like cough or sneezing that is exacerbated any periumbilical pain.  She does admit to a history of nausea, she has had constipation and she is a current smoker.  Past Medical History Past Medical History:  Diagnosis Date  . Medical history non-contributory       Past Surgical History:  Procedure Laterality Date  . CESAREAN SECTION     x 2    Allergies  Allergen Reactions  . Sulfa Antibiotics   . Yellow Dyes (Non-Tartrazine)     Current Outpatient Medications  Medication Sig Dispense Refill  . norgestimate-ethinyl estradiol (SPRINTEC 28) 0.25-35 MG-MCG tablet Take 1 tablet by mouth daily.    . Sennosides (SENNA LAX PO) Take by mouth.     No current facility-administered medications for this visit.    Family History Family History  Problem Relation Age of Onset  . Diabetes Mother   . Liver disease Father       Social History Social History   Tobacco Use  . Smoking status: Current Some Day Smoker    Packs/day: 0.15    Types: Cigarettes  . Smokeless tobacco: Never Used  Vaping Use  . Vaping  Use: Former  Substance Use Topics  . Alcohol use: Not Currently  . Drug use: Not Currently        Review of Systems  Constitutional: Negative.   HENT: Negative.   Eyes: Negative.   Respiratory: Negative.   Cardiovascular: Negative.   Gastrointestinal: Negative for blood in stool, diarrhea, heartburn, melena and vomiting.  Genitourinary: Negative.   Skin: Negative.   Neurological: Negative.   Psychiatric/Behavioral: Negative.       Physical Exam Blood pressure 124/78, pulse 75, temperature 98.8 F (37.1 C), height 5' 4" (1.626 m), weight 192 lb (87.1 kg), last menstrual period 04/18/2020, SpO2 98 %. Last Weight  Most recent update: 05/05/2020 11:24 AM   Weight  87.1 kg (192 lb)            CONSTITUTIONAL: Well developed, and nourished, appropriately responsive and aware without distress.   EYES: Sclera non-icteric.   EARS, NOSE, MOUTH AND THROAT: Mask worn.   Hearing is intact to voice.  NECK: Trachea is midline, and there is no jugular venous distension.  LYMPH NODES:  Lymph nodes in the neck are not enlarged. RESPIRATORY:  Lungs are clear, and breath sounds are equal bilaterally. Normal respiratory effort without pathologic use of accessory muscles. CARDIOVASCULAR: Heart is regular in rate and rhythm. GI: The abdomen is  soft, nontender, and nondistended. There were no palpable masses.   She has readily notable   diastases recti, with a prominent bulge on the cephalad aspect of her umbilicus.  It is difficult to fully appreciate the size of the periumbilical defect, due to the degree of diastases present immediately adjacent to it.  She does not reproduce the hernia bulge but it is readily apparent grossly.  I did not appreciate hepatosplenomegaly. There were normal bowel sounds. MUSCULOSKELETAL:  Symmetrical muscle tone appreciated in all four extremities.    SKIN: Skin turgor is normal. No pathologic skin lesions appreciated.  NEUROLOGIC:  Motor and sensation appear grossly  normal.  Cranial nerves are grossly without defect. PSYCH:  Alert and oriented to person, place and time. Affect is appropriate for situation.  Data Reviewed I have personally reviewed what is currently available of the patient's imaging, recent labs and medical records.   Labs:  CBC Latest Ref Rng & Units 04/20/2020 09/23/2018 09/22/2018  WBC 4.0 - 10.5 K/uL 10.6(H) 11.6(H) 11.9(H)  Hemoglobin 12.0 - 15.0 g/dL 15.4(H) 11.8(L) 14.2  Hematocrit 36 - 46 % 43.7 36.1 41.4  Platelets 150 - 400 K/uL 307 370 402(H)   CMP Latest Ref Rng & Units 04/20/2020 09/23/2018 09/22/2018  Glucose 70 - 99 mg/dL 90 106(Y) 694(W)  BUN 6 - 20 mg/dL 7 8 6   Creatinine 0.44 - 1.00 mg/dL 5.46 2.70  Sodium 135 - 145 mmol/L 139 137 134(L)  Potassium 3.5 - 5.1 mmol/L 4.1 3.9 2.9(L)  Chloride 98 - 111 mmol/L 103 104 93(L)  CO2 22 - 32 mmol/L 28 26 28   Calcium 8.9 - 10.3 mg/dL 9.1 3.50) 8.9      Imaging:  Within last 24 hrs: No results found.  Assessment    Diastases recti, periumbilical ventral hernia. Patient Active Problem List   Diagnosis Date Noted  . Sepsis (HCC) 09/22/2018    Plan    Robotic ventral hernia repair with mesh.  Anticipating a degree of plication of her linea alba.  We discussed the difficult transition between plicated linea alba and the persistent epigastric diastases.  We all are in agreement not to pursue any type of component separation to avoid this transition.  We did discuss other options in terms of primary suture repair or direct open hernia repair with placement of mesh.  I feel more comfortable with a intraperitoneal placement of mesh to reinforce the primary closure.  I believe this would be best completed robotically. We discussed risks of mesh, risks of recurrence, risks of complications involving the mesh including infection/transition/adhesions bowel obstruction etc. I discussed possibility of incarceration, strangulation, enlargement in size over time, and the risk  of emergency surgery in the face of strangulation.  Also discussed the risk of surgery including recurrence which can be up to 30% in the case of complex hernias, use of prosthetic materials (mesh) and the increased risk of infxn, post-op infxn and the possible need for re-operation and removal of mesh if used, possibility of post-op SBO or ileus, and the risks of general anesthetic including MI, CVA, sudden death or even reaction to anesthetic medications. The patient and family understands the risks, any and all questions were answered to the patient's satisfaction.  We also discussed smoking cessation, that I will obtain a cotinine prior to her surgery and my strong advice that she have at least 2 full weeks of cessation prior to her general anesthetic.  She believes that will not be an issue, though she feels she may likely resume smoking after her surgery is past. Face-to-face time spent with the patient  and accompanying care providers(if present) was 30 minutes, with more than 50% of the time spent counseling, educating, and coordinating care of the patient.      Campbell Lerner M.D., FACS 05/05/2020, 1:06 PM

## 2020-05-11 ENCOUNTER — Telehealth: Payer: Self-pay | Admitting: Surgery

## 2020-05-11 NOTE — Telephone Encounter (Signed)
Pt has been advised of Pre-Admission date/time, COVID Testing date and Surgery date.  Surgery Date: 05/29/20 Preadmission Testing Date: 05/22/20 (phone 8a-1p) Covid Testing Date: 05/27/20 - patient [facility] to [facility] ([address] [facility]) between 8a-1p   Patient has been made aware to call [phone], between 1-3:00pm the day before surgery, to find out what time to arrive for surgery.

## 2020-05-22 ENCOUNTER — Other Ambulatory Visit: Payer: Self-pay

## 2020-05-22 ENCOUNTER — Encounter
Admission: RE | Admit: 2020-05-22 | Discharge: 2020-05-22 | Disposition: A | Discharge status: Home / Self Care | Payer: Medicaid Other | Source: Ambulatory Visit | Attending: Surgery | Admitting: Surgery

## 2020-05-22 DIAGNOSIS — Z01818 Encounter for other preprocedural examination: Secondary | ICD-10-CM | POA: Insufficient documentation

## 2020-05-22 NOTE — Pre-Procedure Instructions (Signed)
SPOKE WITH AMY IN PHARMACY REGARDING YELLOW DYE ALLERGY AND CONTRAINDICATION FOR PERIDEX/MEDLINE MOUTHRINSE. AMY STATES THAT WHEN SHE PUTS IN PERIDEX IT DOES NOT COME UP CONTRAINDICATION. SHE STATES TO USE THE PERIDEX AND NOT THE MEDLINE MOUTH RINSE

## 2020-05-22 NOTE — Patient Instructions (Signed)
Your procedure is scheduled on: 05-29-20 FRIDAY Report to Same Day Surgery 2nd floor medical mall [facility] Entrance-take elevator on left to 2nd floor.  Check in with surgery information desk.) To find out your arrival time please call [phone] between 1PM - 3PM on 05-28-20 THURSDAY  Remember: Instructions that are not followed completely may result in serious medical risk, up to and including death, or upon the discretion of your surgeon and anesthesiologist your surgery may need to be rescheduled.    _x___ 1. Do not eat food after midnight the night before your procedure. NO GUM OR CANDY AFTER MIDNIGHT. You may drink clear liquids up to 2 hours before you are scheduled to arrive at the hospital for your procedure.  Do not drink clear liquids within 2 hours of your scheduled arrival to the hospital.  Clear liquids include  --Water or Apple juice without pulp  --Gatorade  --Black Coffee or Clear Tea (No milk, no creamers, do not add anything to the coffee or Tea-OK TO ADD SUGAR)    __x__ 2. No Alcohol for 24 hours before or after surgery.   __x__3. No Smoking or e-cigarettes for 24 prior to surgery.  Do not use any chewable tobacco products for at least 6 hour prior to surgery   ____  4. Bring all medications with you on the day of surgery if instructed.    __x__ 5. Notify your doctor if there is any change in your medical condition     (cold, fever, infections).    x___6. On the morning of surgery brush your teeth with toothpaste and water.  You may rinse your mouth with mouth wash if you wish.  Do not swallow any toothpaste or mouthwash.   Do not wear jewelry, make-up, hairpins, clips or nail polish.  Do not wear lotions, powders, or perfumes.  Do not shave 48 hours prior to surgery. Men may shave face and neck.  Do not bring valuables to the hospital.    [facility] is not responsible for any belongings or valuables.               Contacts, dentures or bridgework may not be  worn into surgery.  Leave your suitcase in the car. After surgery it may be brought to your room.  For patients admitted to the hospital, discharge time is determined by your treatment team.  _  Patients discharged the day of surgery will not be allowed to drive home.  You will need someone to drive you home and stay with you the night of your procedure.    Please read over the following fact sheets that you were given:   [facility] Preparing for Surgery   ____ TAKE THE FOLLOWING MEDICATION THE MORNING OF SURGERY WITH A SMALL SIP OF WATER. These include:  1. NONE  2.  3.  4.  5.  6.  ____Fleets enema or Magnesium Citrate as directed.   _x___ Use CHG Soap as directed on instruction sheet   ____ Use inhalers on the day of surgery and bring to hospital day of surgery  ____ Stop Metformin and Janumet 2 days prior to surgery.    ____ Take 1/2 of usual insulin dose the night before surgery and none on the morning surgery.   ____ Follow recommendations from Cardiologist, Pulmonologist or PCP regarding stopping Aspirin, Coumadin, Plavix ,Eliquis, Effient, or Pradaxa, and Pletal.  X____Stop Anti-inflammatories such as Advil, Aleve, Ibuprofen, Motrin, Naproxen, Naprosyn, Goodies powders or aspirin products  NOW-OK to take Tylenol    ____ Stop supplements until after surgery.    ____ Bring C-Pap to the hospital.

## 2020-05-27 ENCOUNTER — Encounter: Payer: Self-pay | Admitting: Urgent Care

## 2020-05-27 ENCOUNTER — Other Ambulatory Visit
Admission: RE | Admit: 2020-05-27 | Discharge: 2020-05-27 | Disposition: A | Discharge status: Home / Self Care | Payer: Medicaid Other | Source: Ambulatory Visit | Attending: Surgery | Admitting: Surgery

## 2020-05-27 ENCOUNTER — Other Ambulatory Visit: Payer: Self-pay

## 2020-05-27 DIAGNOSIS — Z01812 Encounter for preprocedural laboratory examination: Secondary | ICD-10-CM | POA: Insufficient documentation

## 2020-05-27 DIAGNOSIS — K439 Ventral hernia without obstruction or gangrene: Secondary | ICD-10-CM | POA: Insufficient documentation

## 2020-05-27 DIAGNOSIS — Z20822 Contact with and (suspected) exposure to covid-19: Secondary | ICD-10-CM | POA: Insufficient documentation

## 2020-05-27 LAB — CBC WITH DIFFERENTIAL/PLATELET
Abs Immature Granulocytes: 0.03 10*3/uL (ref 0.00–0.07)
Basophils Absolute: 0.1 10*3/uL (ref 0.0–0.1)
Basophils Relative: 1 %
Eosinophils Absolute: 0.1 10*3/uL (ref 0.0–0.5)
Eosinophils Relative: 1 %
HCT: 40.1 % (ref 36.0–46.0)
Hemoglobin: 13.9 g/dL (ref 12.0–15.0)
Immature Granulocytes: 0 %
Lymphocytes Relative: 22 %
Lymphs Abs: 2.1 10*3/uL (ref 0.7–4.0)
MCH: 31.6 pg (ref 26.0–34.0)
MCHC: 34.7 g/dL (ref 30.0–36.0)
MCV: 91.1 fL (ref 80.0–100.0)
Monocytes Absolute: 0.6 10*3/uL (ref 0.1–1.0)
Monocytes Relative: 6 %
Neutro Abs: 6.5 10*3/uL (ref 1.7–7.7)
Neutrophils Relative %: 70 %
Platelets: 278 10*3/uL (ref 150–400)
RBC: 4.4 MIL/uL (ref 3.87–5.11)
RDW: 12.4 % (ref 11.5–15.5)
WBC: 9.4 10*3/uL (ref 4.0–10.5)
nRBC: 0 % (ref 0.0–0.2)

## 2020-05-27 LAB — SARS CORONAVIRUS 2 (TAT 6-24 HRS): SARS Coronavirus 2: NEGATIVE

## 2020-05-27 LAB — COMPREHENSIVE METABOLIC PANEL
ALT: 24 U/L (ref 0–44)
AST: 21 U/L (ref 15–41)
Albumin: 4.1 g/dL (ref 3.5–5.0)
Alkaline Phosphatase: 44 U/L (ref 38–126)
Anion gap: 9 (ref 5–15)
BUN: 11 mg/dL (ref 6–20)
CO2: 25 mmol/L (ref 22–32)
Calcium: 9.4 mg/dL (ref 8.9–10.3)
Chloride: 104 mmol/L (ref 98–111)
Creatinine, Ser: 0.59 mg/dL (ref 0.44–1.00)
GFR calc Af Amer: >60 mL/min (ref >60)
GFR calc non Af Amer: >60 mL/min (ref >60)
Glucose, Bld: 87 mg/dL (ref 70–99)
Potassium: 4 mmol/L (ref 3.5–5.1)
Sodium: 138 mmol/L (ref 135–145)
Total Bilirubin: 0.7 mg/dL (ref 0.3–1.2)
Total Protein: 7.3 g/dL (ref 6.5–8.1)

## 2020-05-29 ENCOUNTER — Encounter
Admission: RE | Disposition: A | Discharge status: Home / Self Care | Payer: Self-pay | Source: Home / Self Care | Attending: Surgery

## 2020-05-29 ENCOUNTER — Ambulatory Visit: Payer: Self-pay | Admitting: Anesthesiology

## 2020-05-29 ENCOUNTER — Other Ambulatory Visit: Payer: Self-pay

## 2020-05-29 ENCOUNTER — Encounter: Payer: Self-pay | Admitting: Surgery

## 2020-05-29 ENCOUNTER — Ambulatory Visit
Admission: RE | Admit: 2020-05-29 | Discharge: 2020-05-29 | Disposition: A | Discharge status: Home / Self Care | Payer: Self-pay | Attending: Surgery | Admitting: Surgery

## 2020-05-29 DIAGNOSIS — E669 Obesity, unspecified: Secondary | ICD-10-CM

## 2020-05-29 DIAGNOSIS — Z8614 Personal history of Methicillin resistant Staphylococcus aureus infection: Secondary | ICD-10-CM | POA: Insufficient documentation

## 2020-05-29 DIAGNOSIS — F17210 Nicotine dependence, cigarettes, uncomplicated: Secondary | ICD-10-CM | POA: Insufficient documentation

## 2020-05-29 DIAGNOSIS — K436 Other and unspecified ventral hernia with obstruction, without gangrene: Secondary | ICD-10-CM

## 2020-05-29 DIAGNOSIS — K439 Ventral hernia without obstruction or gangrene: Secondary | ICD-10-CM | POA: Insufficient documentation

## 2020-05-29 HISTORY — PX: XI ROBOTIC ASSISTED VENTRAL HERNIA: SHX6789

## 2020-05-29 HISTORY — PX: INSERTION OF MESH: SHX5868

## 2020-05-29 LAB — POCT PREGNANCY, URINE: Preg Test, Ur: NEGATIVE

## 2020-05-29 SURGERY — REPAIR, HERNIA, VENTRAL, ROBOT-ASSISTED
Anesthesia: General

## 2020-05-29 MED ORDER — FAMOTIDINE 20 MG PO TABS
ORAL_TABLET | ORAL | Status: AC
  Filled 2020-05-29: qty 1

## 2020-05-29 MED ORDER — ACETAMINOPHEN 10 MG/ML IV SOLN
INTRAVENOUS | Status: DC | PRN
  Administered 2020-05-29: 1000 mg via INTRAVENOUS

## 2020-05-29 MED ORDER — KETOROLAC TROMETHAMINE 30 MG/ML IJ SOLN
INTRAMUSCULAR | Status: DC | PRN
  Administered 2020-05-29: 30 mg via INTRAVENOUS

## 2020-05-29 MED ORDER — MEPERIDINE HCL 50 MG/ML IJ SOLN: 6.2500 mg | INTRAMUSCULAR | Status: DC | PRN

## 2020-05-29 MED ORDER — FENTANYL CITRATE (PF) 100 MCG/2ML IJ SOLN
INTRAMUSCULAR | Status: AC
Start: 2020-05-29 — End: ?
  Filled 2020-05-29: qty 2

## 2020-05-29 MED ORDER — KETOROLAC TROMETHAMINE 30 MG/ML IJ SOLN
INTRAMUSCULAR | Status: AC
  Filled 2020-05-29: qty 1

## 2020-05-29 MED ORDER — CHLORHEXIDINE GLUCONATE CLOTH 2 % EX PADS
6.0000 | MEDICATED_PAD | Freq: Once | CUTANEOUS | Status: AC
  Administered 2020-05-29: 6 via TOPICAL

## 2020-05-29 MED ORDER — CEFAZOLIN SODIUM-DEXTROSE 2-4 GM/100ML-% IV SOLN
2.0000 g | INTRAVENOUS | Status: AC
  Administered 2020-05-29: 2 g via INTRAVENOUS

## 2020-05-29 MED ORDER — PROPOFOL 10 MG/ML IV BOLUS
INTRAVENOUS | Status: DC | PRN
  Administered 2020-05-29: 180 mg via INTRAVENOUS

## 2020-05-29 MED ORDER — FENTANYL CITRATE (PF) 100 MCG/2ML IJ SOLN
INTRAMUSCULAR | Status: AC
  Administered 2020-05-29: 25 ug
  Filled 2020-05-29: qty 2

## 2020-05-29 MED ORDER — FENTANYL CITRATE (PF) 100 MCG/2ML IJ SOLN
INTRAMUSCULAR | Status: AC
  Filled 2020-05-29: qty 2

## 2020-05-29 MED ORDER — PROMETHAZINE HCL 25 MG/ML IJ SOLN: 6.2500 mg | INTRAMUSCULAR | Status: DC | PRN

## 2020-05-29 MED ORDER — BUPIVACAINE-EPINEPHRINE 0.25% -1:200000 IJ SOLN
INTRAMUSCULAR | Status: DC | PRN
  Administered 2020-05-29: 20 mL

## 2020-05-29 MED ORDER — FENTANYL CITRATE (PF) 100 MCG/2ML IJ SOLN
INTRAMUSCULAR | Status: DC | PRN
  Administered 2020-05-29 (×4): 50 ug via INTRAVENOUS

## 2020-05-29 MED ORDER — HYDROCODONE-ACETAMINOPHEN 5-325 MG PO TABS: 1.0000 | ORAL_TABLET | Freq: Four times a day (QID) | ORAL | 0 refills | Status: DC | PRN

## 2020-05-29 MED ORDER — CEFAZOLIN SODIUM-DEXTROSE 2-4 GM/100ML-% IV SOLN
INTRAVENOUS | Status: AC
  Filled 2020-05-29: qty 100

## 2020-05-29 MED ORDER — ROCURONIUM BROMIDE 100 MG/10ML IV SOLN
INTRAVENOUS | Status: DC | PRN
  Administered 2020-05-29: 20 mg via INTRAVENOUS
  Administered 2020-05-29: 50 mg via INTRAVENOUS
  Administered 2020-05-29: 10 mg via INTRAVENOUS

## 2020-05-29 MED ORDER — IBUPROFEN 800 MG PO TABS: 800.0000 mg | ORAL_TABLET | Freq: Three times a day (TID) | ORAL | 0 refills | Status: AC | PRN

## 2020-05-29 MED ORDER — MIDAZOLAM HCL 2 MG/2ML IJ SOLN
INTRAMUSCULAR | Status: AC
  Filled 2020-05-29: qty 2

## 2020-05-29 MED ORDER — MIDAZOLAM HCL 2 MG/2ML IJ SOLN
INTRAMUSCULAR | Status: DC | PRN
  Administered 2020-05-29: 2 mg via INTRAVENOUS

## 2020-05-29 MED ORDER — LACTATED RINGERS IV SOLN: INTRAVENOUS | Status: DC

## 2020-05-29 MED ORDER — ORAL CARE MOUTH RINSE: 15.0000 mL | Freq: Once | OROMUCOSAL | Status: DC

## 2020-05-29 MED ORDER — ONDANSETRON HCL 4 MG/2ML IJ SOLN
INTRAMUSCULAR | Status: AC
  Filled 2020-05-29: qty 2

## 2020-05-29 MED ORDER — DEXMEDETOMIDINE HCL 200 MCG/2ML IV SOLN
INTRAVENOUS | Status: DC | PRN
  Administered 2020-05-29: 8 ug via INTRAVENOUS
  Administered 2020-05-29: 4 ug via INTRAVENOUS
  Administered 2020-05-29: 8 ug via INTRAVENOUS

## 2020-05-29 MED ORDER — ONDANSETRON HCL 4 MG/2ML IJ SOLN
INTRAMUSCULAR | Status: DC | PRN
  Administered 2020-05-29: 4 mg via INTRAVENOUS

## 2020-05-29 MED ORDER — PROPOFOL 10 MG/ML IV BOLUS
INTRAVENOUS | Status: AC
  Filled 2020-05-29: qty 40

## 2020-05-29 MED ORDER — FENTANYL CITRATE (PF) 100 MCG/2ML IJ SOLN
25.0000 ug | INTRAMUSCULAR | Status: AC | PRN
  Administered 2020-05-29 (×4): 25 ug via INTRAVENOUS

## 2020-05-29 MED ORDER — PHENYLEPHRINE HCL (PRESSORS) 10 MG/ML IV SOLN
INTRAVENOUS | Status: DC | PRN
  Administered 2020-05-29: 100 ug via INTRAVENOUS

## 2020-05-29 MED ORDER — LIDOCAINE HCL (CARDIAC) PF 100 MG/5ML IV SOSY
PREFILLED_SYRINGE | INTRAVENOUS | Status: DC | PRN
  Administered 2020-05-29: 100 mg via INTRAVENOUS

## 2020-05-29 MED ORDER — BUPIVACAINE LIPOSOME 1.3 % IJ SUSP: 20.0000 mL | Freq: Once | INTRAMUSCULAR | Status: DC

## 2020-05-29 MED ORDER — FENTANYL CITRATE (PF) 100 MCG/2ML IJ SOLN
INTRAMUSCULAR | Status: AC
  Administered 2020-05-29: 25 ug via INTRAVENOUS
  Filled 2020-05-29: qty 2

## 2020-05-29 MED ORDER — BUPIVACAINE-EPINEPHRINE (PF) 0.25% -1:200000 IJ SOLN
INTRAMUSCULAR | Status: AC
  Filled 2020-05-29: qty 30

## 2020-05-29 MED ORDER — DEXAMETHASONE SODIUM PHOSPHATE 10 MG/ML IJ SOLN
INTRAMUSCULAR | Status: AC
  Filled 2020-05-29: qty 1

## 2020-05-29 MED ORDER — SUGAMMADEX SODIUM 200 MG/2ML IV SOLN
INTRAVENOUS | Status: DC | PRN
  Administered 2020-05-29: 200 mg via INTRAVENOUS

## 2020-05-29 MED ORDER — ACETAMINOPHEN 10 MG/ML IV SOLN
INTRAVENOUS | Status: AC
  Filled 2020-05-29: qty 100

## 2020-05-29 MED ORDER — LIDOCAINE HCL (PF) 2 % IJ SOLN
INTRAMUSCULAR | Status: AC
  Filled 2020-05-29: qty 5

## 2020-05-29 MED ORDER — CHLORHEXIDINE GLUCONATE 0.12 % MT SOLN: 15.0000 mL | Freq: Once | OROMUCOSAL | Status: DC

## 2020-05-29 MED ORDER — DEXAMETHASONE SODIUM PHOSPHATE 10 MG/ML IJ SOLN
INTRAMUSCULAR | Status: DC | PRN
  Administered 2020-05-29: 10 mg via INTRAVENOUS

## 2020-05-29 SURGICAL SUPPLY — 48 items
ADH SKN CLS APL DERMABOND .7 (GAUZE/BANDAGES/DRESSINGS) ×1
APL PRP STRL LF DISP 70% ISPRP (MISCELLANEOUS) ×1
CANISTER SUCT 1200ML W/VALVE (MISCELLANEOUS) ×3 IMPLANT
CHLORAPREP W/TINT 26 (MISCELLANEOUS) ×3 IMPLANT
COVER TIP SHEARS 8 DVNC (MISCELLANEOUS) ×1 IMPLANT
COVER TIP SHEARS 8MM DA VINCI (MISCELLANEOUS) ×2
COVER WAND RF STERILE (DRAPES) ×3 IMPLANT
DECANTER SPIKE VIAL GLASS SM (MISCELLANEOUS) ×3 IMPLANT
DEFOGGER SCOPE WARMER CLEARIFY (MISCELLANEOUS) ×3 IMPLANT
DERMABOND ADVANCED (GAUZE/BANDAGES/DRESSINGS) ×2
DERMABOND ADVANCED .7 DNX12 (GAUZE/BANDAGES/DRESSINGS) ×1 IMPLANT
DEVICE SECURE STRAP 25 ABSORB (INSTRUMENTS) IMPLANT
DRAPE ARM DVNC X/XI (DISPOSABLE) ×4 IMPLANT
DRAPE COLUMN DVNC XI (DISPOSABLE) ×1 IMPLANT
DRAPE DA VINCI XI ARM (DISPOSABLE) ×8
DRAPE DA VINCI XI COLUMN (DISPOSABLE) ×2
ELECT REM PT RETURN 9FT ADLT (ELECTROSURGICAL) ×3
ELECTRODE REM PT RTRN 9FT ADLT (ELECTROSURGICAL) ×1 IMPLANT
GLOVE ORTHO TXT STRL SZ7.5 (GLOVE) ×9 IMPLANT
GOWN STRL REUS W/ TWL LRG LVL3 (GOWN DISPOSABLE) ×4 IMPLANT
GOWN STRL REUS W/TWL LRG LVL3 (GOWN DISPOSABLE) ×12
GRASPER SUT TROCAR 14GX15 (MISCELLANEOUS) IMPLANT
IRRIGATION STRYKERFLOW (MISCELLANEOUS) IMPLANT
IRRIGATOR STRYKERFLOW (MISCELLANEOUS)
IV NS 1000ML (IV SOLUTION)
IV NS 1000ML BAXH (IV SOLUTION) IMPLANT
KIT PINK PAD W/HEAD ARE REST (MISCELLANEOUS) ×3
KIT PINK PAD W/HEAD ARM REST (MISCELLANEOUS) ×1 IMPLANT
KIT TURNOVER KIT A (KITS) ×3 IMPLANT
MESH VENTRALIGHT ST 4X6IN (Mesh General) ×3 IMPLANT
NEEDLE HYPO 22GX1.5 SAFETY (NEEDLE) ×3 IMPLANT
NEEDLE INSUFFLATION 14GA 120MM (NEEDLE) ×3 IMPLANT
NS IRRIG 500ML POUR BTL (IV SOLUTION) ×3 IMPLANT
PACK LAP CHOLECYSTECTOMY (MISCELLANEOUS) ×3 IMPLANT
SCISSORS METZENBAUM CVD 33 (INSTRUMENTS) ×3 IMPLANT
SEAL CANN UNIV 5-8 DVNC XI (MISCELLANEOUS) ×3 IMPLANT
SEAL XI 5MM-8MM UNIVERSAL (MISCELLANEOUS) ×6
SET TUBE SMOKE EVAC HIGH FLOW (TUBING) ×3 IMPLANT
SOLUTION ELECTROLUBE (MISCELLANEOUS) ×3 IMPLANT
SUT MNCRL 4-0 (SUTURE)
SUT MNCRL 4-0 27XMFL (SUTURE)
SUT STRATAFIX 0 PDS+ CT-2 23 (SUTURE) ×3
SUT VICRYL 0 AB UR-6 (SUTURE) ×3 IMPLANT
SUT VLOC 90 2/L VL 12 GS22 (SUTURE) ×6 IMPLANT
SUTURE MNCRL 4-0 27XMF (SUTURE) IMPLANT
SUTURE STRATFX 0 PDS+ CT-2 23 (SUTURE) ×1 IMPLANT
TROCAR XCEL 12X100 BLDLESS (ENDOMECHANICALS) ×3 IMPLANT
TROCAR Z-THREAD FIOS 11X100 BL (TROCAR) IMPLANT

## 2020-05-29 NOTE — Discharge Instructions (Signed)

## 2020-05-29 NOTE — Anesthesia Postprocedure Evaluation (Signed)
Anesthesia Post Note  Patient: Brenda Phillips  Procedure(s) Performed: XI ROBOTIC ASSISTED VENTRAL HERNIA (N/A ) INSERTION OF MESH (N/A )  Patient location during evaluation: PACU Anesthesia Type: General Level of consciousness: awake and alert Pain management: pain level controlled Vital Signs Assessment: post-procedure vital signs reviewed and stable Respiratory status: spontaneous breathing, nonlabored ventilation, respiratory function stable and patient connected to nasal cannula oxygen Cardiovascular status: blood pressure returned to baseline and stable Postop Assessment: no apparent nausea or vomiting Anesthetic complications: no   No complications documented.   Last Vitals:  Vitals:   05/29/20 1800 05/29/20 1802  BP: (!) 110/59 (!) 110/59  Pulse: 77 81  Resp: 16 14  Temp:  (!) 36 C  SpO2: 100% 98%    Last Pain:  Vitals:   05/29/20 1802  TempSrc: Temporal  PainSc: 2                  Cleda Mccreedy Marie Borowski

## 2020-05-29 NOTE — Progress Notes (Signed)
Pt had IVs in both arms on arrival to post op , IV s dced, sites unremarkable

## 2020-05-29 NOTE — Anesthesia Preprocedure Evaluation (Addendum)
Anesthesia Evaluation  Patient identified by MRN, date of birth, ID band Patient awake    Reviewed: Allergy & Precautions, NPO status , Patient's Chart, lab work & pertinent test results  History of Anesthesia Complications Negative for: history of anesthetic complications  Airway Mallampati: II  TM Distance: >3 FB Neck ROM: Full    Dental  (+) Missing, Poor Dentition   Pulmonary neg sleep apnea, neg COPD, Current Smoker and Patient abstained from smoking.,    breath sounds clear to auscultation- rhonchi (-) wheezing      Cardiovascular Exercise Tolerance: Good (-) hypertension(-) CAD, (-) Past MI, (-) Cardiac Stents and (-) CABG  Rhythm:Regular Rate:Normal - Systolic murmurs and - Diastolic murmurs    Neuro/Psych neg Seizures PSYCHIATRIC DISORDERS Anxiety Depression negative neurological ROS     GI/Hepatic negative GI ROS, Neg liver ROS,   Endo/Other  negative endocrine ROSneg diabetes  Renal/GU negative Renal ROS     Musculoskeletal negative musculoskeletal ROS (+)   Abdominal (+) + obese,   Peds  Hematology negative hematology ROS (+)   Anesthesia Other Findings Past Medical History: No date: Anxiety     Comment:  H/O No date: Depression     Comment:  H/O 2011: History of methicillin resistant staphylococcus aureus (MRSA)   Reproductive/Obstetrics                             Anesthesia Physical Anesthesia Plan  ASA: II  Anesthesia Plan: General   Post-op Pain Management:    Induction: Intravenous  PONV Risk Score and Plan: 1 and Ondansetron, Dexamethasone and Midazolam  Airway Management Planned: Oral ETT  Additional Equipment:   Intra-op Plan:   Post-operative Plan: Extubation in OR  Informed Consent: I have reviewed the patients History and Physical, chart, labs and discussed the procedure including the risks, benefits and alternatives for the proposed anesthesia  with the patient or authorized representative who has indicated his/her understanding and acceptance.     Dental advisory given  Plan Discussed with: CRNA and Anesthesiologist  Anesthesia Plan Comments:         Anesthesia Quick Evaluation

## 2020-05-29 NOTE — Op Note (Signed)
Robotic assisted laparoscopic ventral hernia Repair IPOM using oval ventralight BARD mesh   Pre-operative Diagnosis: ventral hernia   Post-operative Diagnosis: same   Surgeon:  Campbell Lerner, M.D., FACS   Anesthesia: Gen. with endotracheal tube   Findings: 2 adjacent fascial defects at the umbilicus, one involving the inferior most aspect of the diastases recti.  Total defect size 2-1/2 to 3 cm.  Estimated Blood Loss: 15 mL   Complications: none           Procedure Details  The patient was seen again in the Holding Room. The benefits, complications, treatment options, and expected outcomes were discussed with the patient. The risks of bleeding, infection, recurrence of symptoms, failure to resolve symptoms, bowel injury, mesh placement, mesh infection, any of which could require further surgery were reviewed with the patient. The likelihood of improving the patient's symptoms with return to their baseline status is good.  The patient and/or family concurred with the proposed plan, giving informed consent.  The patient was taken to Operating Room, identified and the procedure verified.  A Time Out was held and the above information confirmed.   Prior to the induction of general anesthesia, antibiotic prophylaxis was administered. VTE prophylaxis was in place. General endotracheal anesthesia was then administered and tolerated well. After the induction, the abdomen was prepped with Chloraprep and draped in the sterile fashion. The patient was positioned in the supine position. After local infiltration of quarter percent Marcaine with epinephrine, stab incision was made left upper quadrant.  Just below the costal margin approximately midclavicular line the Veress needle is passed with sensation of the layers to penetrate the abdominal wall and into the peritoneum.  Saline drop test is confirmed peritoneal placement.  Insufflation is initiated with carbon dioxide to pressures of 15 mmHg. Marcaine  quarter percent with epinephrine was used to inject all the incision sites. We used a left upper quadrant subcostal incision and using an robotic 8.5 mm trocar, it was inserted and pneumoperitoneum obtained.  No hemodynamic compromise, no evidence of intra-abdominal adhesions or injury noted.   2 additional 8 mm ports were placed under direct visualization on the left lateral abdomen.  I visualized the hernia and there was a supraumbilical hernia measuring approximately 2 cm.    The robot was brought to the surgical field and docked in the standard fashion.  We made sure that all instrumentation was kept under direct vision at all times and there was no collision between the arms.  I scrubbed out and went to the console. Falciform and adjacent preperitoneal adipose and umbilical ligaments and hernia sac were taken down with electrocautery to allow adequate mesh placement to be secured to the fascial tissue.  This revealed another incarcerated hernia defect adjacent to the prior with preperitoneal adipose filling the lumen.  A 10 cm x 15 cm oval mesh was selected.  Long access applied to the midline from cephalad to caudad. Confirm and measured that the defect was 3 cm together.   Using a 0 V-lock suture we closed the ventral defect primarily, extending the vertical closure along the linea alba, diminishing the degree of diastases cephalad.   The PMI was used to pierce the defect in the center.  Using the mesh and stay sutures of 0 Vicryl we were able to bring the mesh towards the abdominal wall.  The mesh was secured circumferentially to the abdominal wall using 2-0 V-lock in the standard fashion.  An additional 2 oh V-Loc was used to complete  the circumferential suture, and run across the middle of the mesh to provide additional adhesion to the anterior abdominal wall.    The mesh appeared well secured against the  abdominal wall. A second look laparoscopy revealed no evidence of intra-abdominal injury.     All the needles were removed under direct visualization.  The instruments were removed and the robot was undocked.  The laparoscopic ports were removed under direct visualization and the pneumoperitoneum was deflated.  Incisions were closed with  4-0 Monocryl   Dermabond was used to coat the skin.  Patient tolerated procedure well and there were no immediate complications. Needle and laparotomy counts were correct    Campbell Lerner M.D., [facility] 05/29/2020 4:51 PM

## 2020-05-29 NOTE — Anesthesia Procedure Notes (Signed)
Procedure Name: Intubation Date/Time: 05/29/2020 2:06 PM Performed by: Gentry Fitz, CRNA Pre-anesthesia Checklist: Patient identified, Emergency Drugs available, Suction available and Patient being monitored Patient Re-evaluated:Patient Re-evaluated prior to induction Oxygen Delivery Method: Circle system utilized Preoxygenation: Pre-oxygenation with 100% oxygen Induction Type: IV induction Ventilation: Mask ventilation without difficulty Laryngoscope Size: Mac Grade View: Grade II Tube type: Oral Number of attempts: 1 Airway Equipment and Method: Stylet Placement Confirmation: ETT inserted through vocal cords under direct vision,  positive ETCO2 and breath sounds checked- equal and bilateral Secured at: 24 cm Dental Injury: Teeth and Oropharynx as per pre-operative assessment

## 2020-05-29 NOTE — Transfer of Care (Signed)
Immediate Anesthesia Transfer of Care Note  Patient: Brenda Phillips  Procedure(s) Performed: XI ROBOTIC ASSISTED VENTRAL HERNIA (N/A ) INSERTION OF MESH (N/A )  Patient Location: PACU  Anesthesia Type:General  Level of Consciousness: awake, alert  and oriented  Airway & Oxygen Therapy: Patient Spontanous Breathing and Patient connected to face mask oxygen  Post-op Assessment: Report given to RN and Post -op Vital signs reviewed and stable  Post vital signs: Reviewed and stable  Last Vitals:  Vitals Value Taken Time  BP 140/78 05/29/20 1650  Temp    Pulse 86 05/29/20 1653  Resp 27 05/29/20 1653  SpO2 98 % 05/29/20 1653  Vitals shown include unvalidated device data.  Last Pain:  Vitals:   05/29/20 1233  TempSrc: Temporal  PainSc: 0-No pain         Complications: No complications documented.

## 2020-05-29 NOTE — Interval H&P Note (Signed)
History and Physical Interval Note:  05/29/2020 1:30 PM  Brenda Phillips  has presented today for surgery, with the diagnosis of Ventral hernia.  The various methods of treatment have been discussed with the patient and family. After consideration of risks, benefits and other options for treatment, the patient has consented to  Procedure(s): XI ROBOTIC ASSISTED VENTRAL HERNIA (N/A) INSERTION OF MESH (N/A) as a surgical intervention.  The patient's history has been reviewed, patient examined, no change in status, stable for surgery.  I have reviewed the patient's chart and labs.  Questions were answered to the patient's satisfaction.     Campbell Lerner

## 2020-05-30 ENCOUNTER — Encounter: Payer: Self-pay | Admitting: Surgery

## 2020-06-08 LAB — NICOTINE/COTININE METABOLITES
Cotinine: 95.1 ng/mL
Nicotine: 8.9 ng/mL

## 2020-06-11 ENCOUNTER — Encounter: Payer: Self-pay | Admitting: Surgery

## 2020-06-11 ENCOUNTER — Ambulatory Visit (INDEPENDENT_AMBULATORY_CARE_PROVIDER_SITE_OTHER): Payer: Self-pay | Admitting: Surgery

## 2020-06-11 ENCOUNTER — Other Ambulatory Visit: Payer: Self-pay

## 2020-06-11 VITALS — BP 138/90 | HR 88 | Temp 98.1°F | Resp 12 | Ht 64.0 in | Wt 194.0 lb

## 2020-06-11 DIAGNOSIS — Z9889 Other specified postprocedural states: Secondary | ICD-10-CM

## 2020-06-11 DIAGNOSIS — Z8719 Personal history of other diseases of the digestive system: Secondary | ICD-10-CM

## 2020-06-11 NOTE — Patient Instructions (Signed)
Follow up as needed, call the office if you have any questions or concerns.   GENERAL POST-OPERATIVE PATIENT INSTRUCTIONS   WOUND CARE INSTRUCTIONS:  Keep a dry clean dressing on the wound if there is drainage. The initial bandage may be removed after 24 hours.  Once the wound has quit draining you may leave it open to air.  If clothing rubs against the wound or causes irritation and the wound is not draining you may cover it with a dry dressing during the daytime.  Try to keep the wound dry and avoid ointments on the wound unless directed to do so.  If the wound becomes bright red and painful or starts to drain infected material that is not clear, please contact your physician immediately.  If the wound is mildly pink and has a thick firm ridge underneath it, this is normal, and is referred to as a healing ridge.  This will resolve over the next 4-6 weeks.  BATHING: You may shower if you have been informed of this by your surgeon. However, Please do not submerge in a tub, hot tub, or pool until incisions are completely sealed or have been told by your surgeon that you may do so.  DIET:  You may eat any foods that you can tolerate.  It is a good idea to eat a high fiber diet and take in plenty of fluids to prevent constipation.  If you do become constipated you may want to take a mild laxative or take ducolax tablets on a daily basis until your bowel habits are regular.  Constipation can be very uncomfortable, along with straining, after recent surgery.  ACTIVITY:  You are encouraged to cough and deep breath or use your incentive spirometer if you were given one, every 15-30 minutes when awake.  This will help prevent respiratory complications and low grade fevers post-operatively if you had a general anesthetic.  You may want to hug a pillow when coughing and sneezing to add additional support to the surgical area, if you had abdominal or chest surgery, which will decrease pain during these times.  You  are encouraged to walk and engage in light activity for the next two weeks.  You should not lift more than 10-15 pounds, 4-6 weeks after surgery as it could put you at increased risk for complications.  Twenty pounds is roughly equivalent to a plastic bag of groceries. At that time- Listen to your body when lifting, if you have pain when lifting, stop and then try again in a few days. Soreness after doing exercises or activities of daily living is normal as you get back in to your normal routine.  MEDICATIONS:  Try to take narcotic medications and anti-inflammatory medications, such as tylenol, ibuprofen, naprosyn, etc., with food.  This will minimize stomach upset from the medication.  Should you develop nausea and vomiting from the pain medication, or develop a rash, please discontinue the medication and contact your physician.  You should not drive, make important decisions, or operate machinery when taking narcotic pain medication.  SUNBLOCK Use sun block to incision area over the next year if this area will be exposed to sun. This helps decrease scarring and will allow you avoid a permanent darkened area over your incision.  QUESTIONS:  Please feel free to call our office if you have any questions, and we will be glad to assist you.     

## 2020-06-11 NOTE — Progress Notes (Signed)
[facility] POST-OP OFFICE VISIT  06/11/2020  HPI: Brenda Phillips is a 34 y.o. female 13 days s/p robotic ventral hernia repair.  She denies any fevers, chills, nausea, vomiting.  She reports a good appetite and that her bowel movements have normalized now.  She denies pain and admits to a mild soreness of the incision sites and mid abdomen.  She has changed and is now able to sleep on her side.  She denies any drainage from any of her incision sites.  Vital signs: BP 138/90   Pulse 88   Temp 98.1 F (36.7 C)   Resp 12   Ht 5\' 4"  (1.626 m)   Wt 194 lb (88 kg)   SpO2 96%   BMI 33.30 kg/m    Physical Exam: Constitutional: She appears well, quite pleased with her current results. Abdomen: She comes wearing her abdominal binder and is quite mobile and removing it.  She has addressed the Dermabond absence with her own dressings and butterfly tapes.  The incisions are all clean dry and intact. There is no evidence of any masses related to any of the incisions, there is the anticipated thickening at the prior hernia site the abdomen is soft surrounding the area of the mesh.  And there is no evidence of tenderness, involuntary or voluntary guarding.  Assessment/Plan: This is a 34 y.o. female 13 days s/p robotic ventral hernia repair.  Patient Active Problem List   Diagnosis Date Noted  . Ventral hernia without obstruction or gangrene 05/05/2020  . Class 1 obesity without serious comorbidity with body mass index (BMI) of 32.0 to 32.9 in adult 05/05/2020  . Sepsis (HCC) 09/22/2018    -She is well aware that she any weight lifting restrictions will be completely removed in another month.  She may continue her activity in the interim as tolerated.  And will be glad to see her again should any concerns arise.   09/24/2018 M.D., FACS 06/11/2020, 3:53 PM

## 2020-07-18 IMAGING — CT CT ANGIO CHEST
2 of 6 series · 18 of 46 positions shown · IV contrast (APPLIED)
Comparison: None.

CLINICAL DATA: Cough, shortness of breath, hypoxia

EXAM:
CT ANGIOGRAPHY CHEST WITH CONTRAST
TECHNIQUE: Multidetector CT imaging of the chest was performed using the
standard protocol during bolus administration of intravenous
contrast. Multiplanar CT image reconstructions and MIPs were
obtained to evaluate the vascular anatomy.
CONTRAST:  75mL [id number] IOPAMIDOL ([id number]) INJECTION 76%

[Series 6: thins · axial · 0.73mm/px · z∈[-975,-713]mm · 15 of 288 slices shown]
[im 13/288  lung]
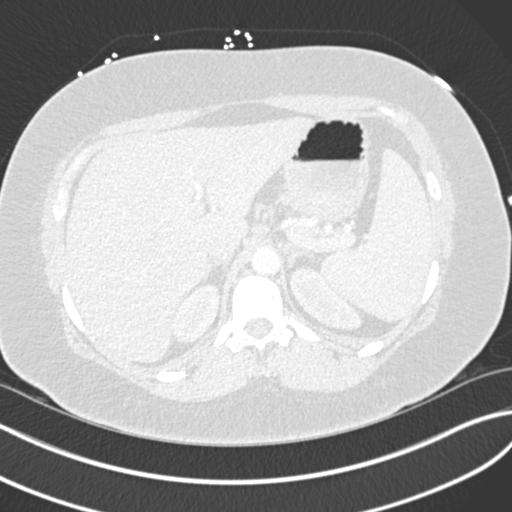
[im 38/288  soft-tissue]
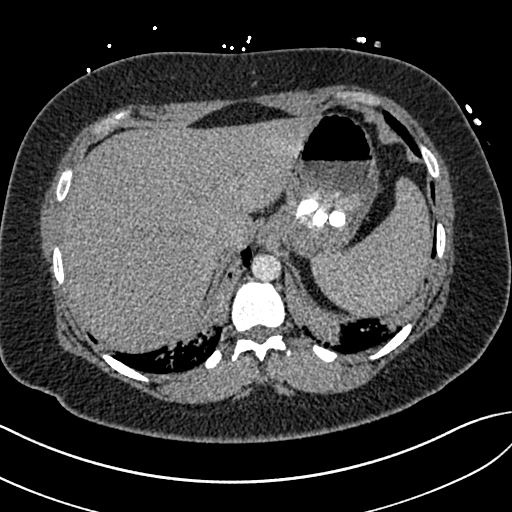
[im 50/288  lung]
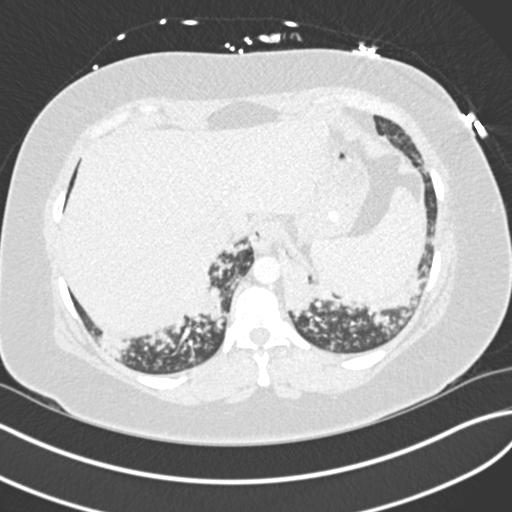
[im 75/288  soft-tissue]
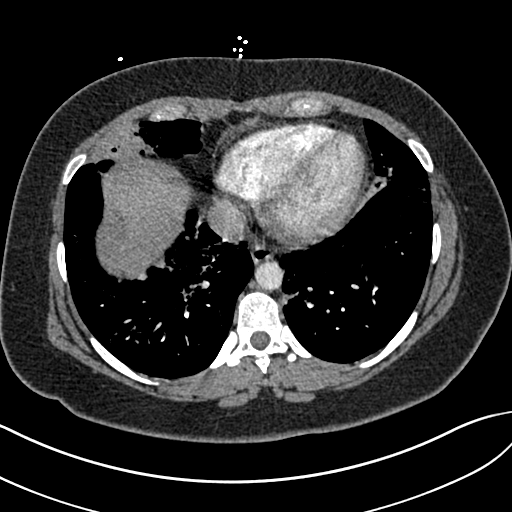
[im 88/288  lung]
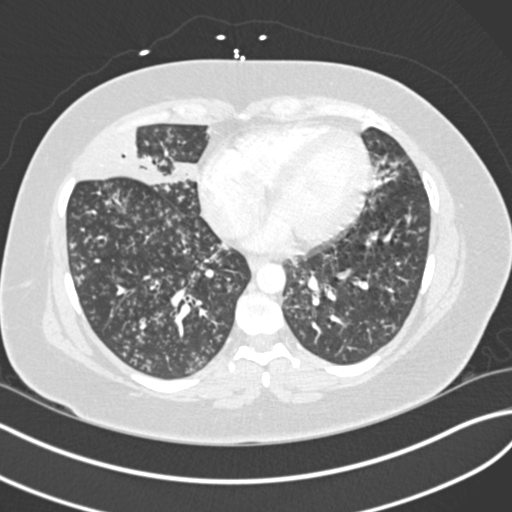
[im 113/288  soft-tissue]
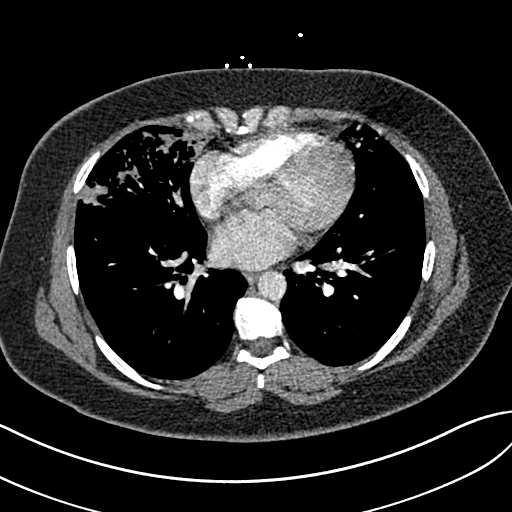
[im 125/288  lung]
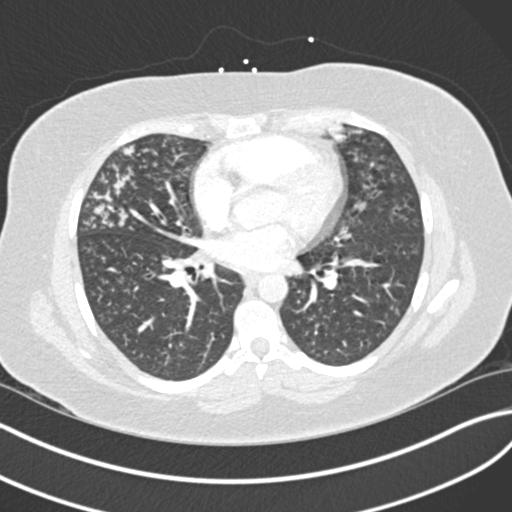
[im 150/288  soft-tissue]
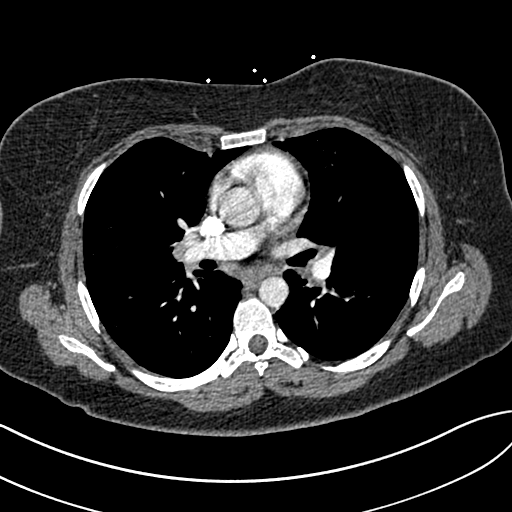
[im 163/288  lung]
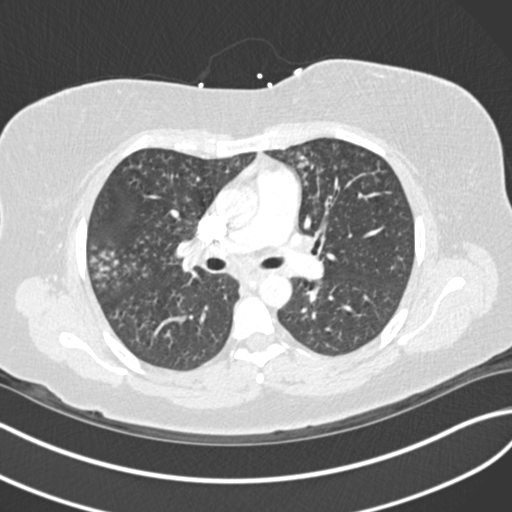
[im 175/288  soft-tissue]
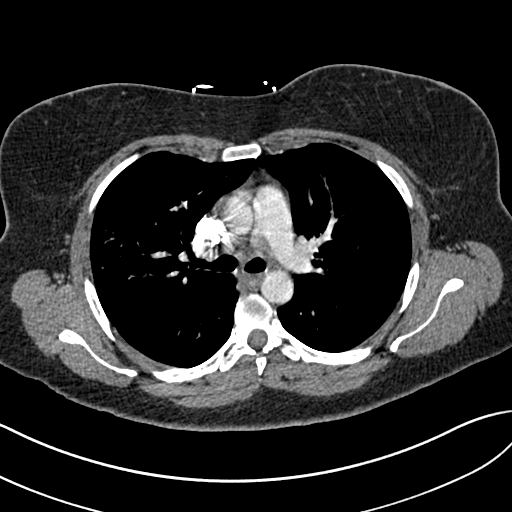
[im 200/288  lung]
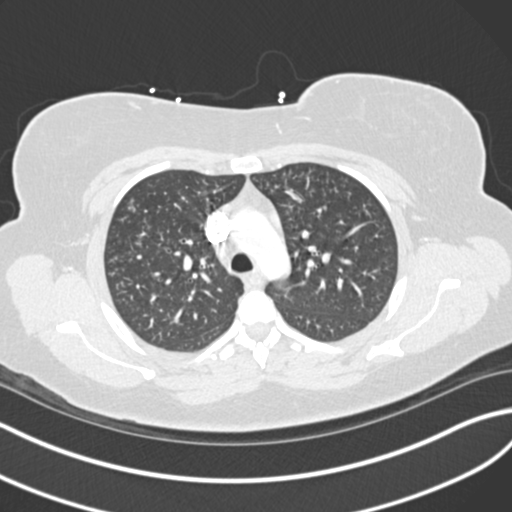
[im 213/288  soft-tissue]
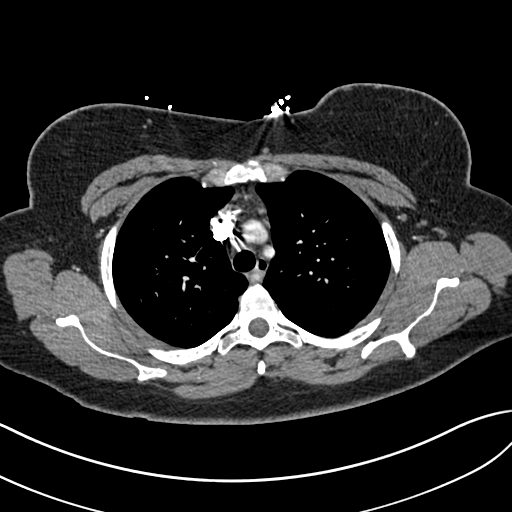
[im 238/288  lung]
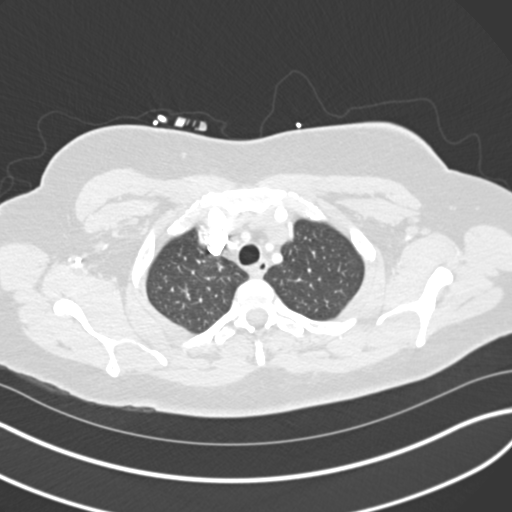
[im 250/288  soft-tissue]
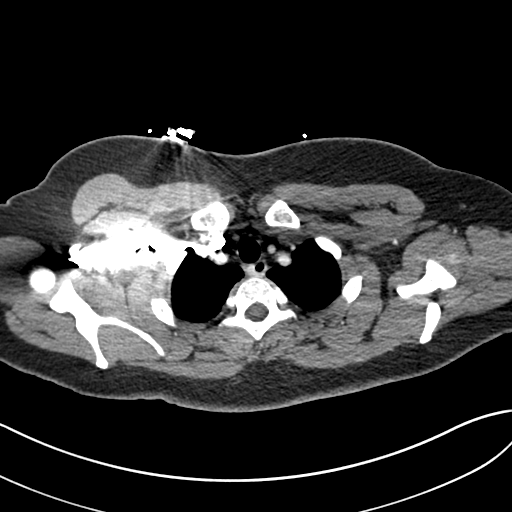
[im 275/288  lung]
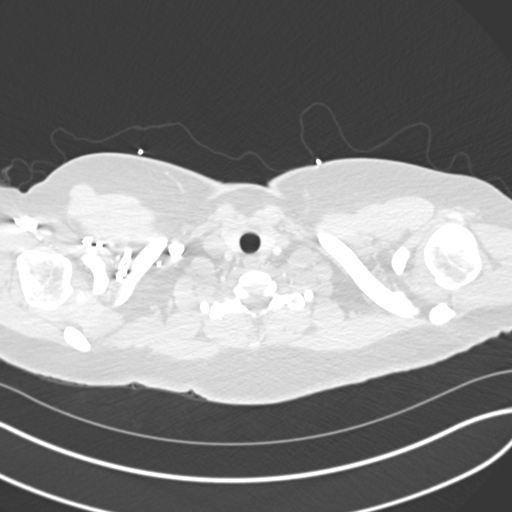

[Series 8: coronal mpr · coronal · 0.56mm/px · 3 of 89 slices shown]
[im 23/89  soft-tissue]
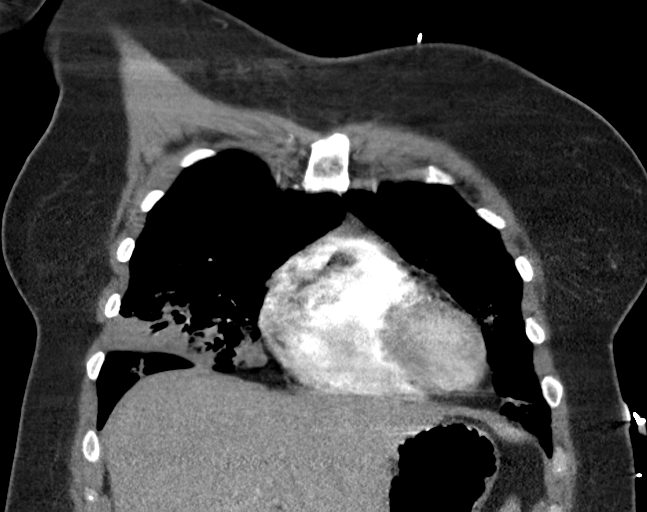
[im 45/89  soft-tissue]
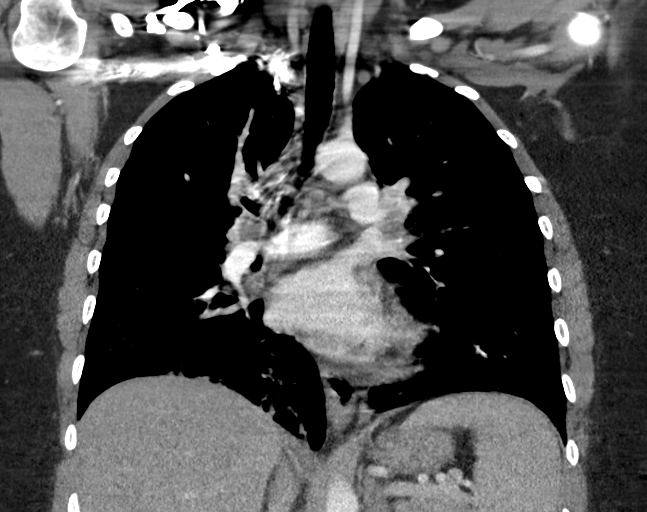
[im 67/89  soft-tissue]
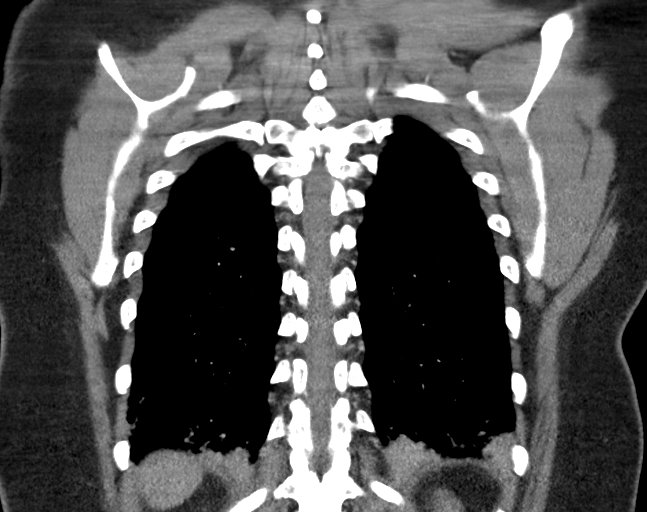

[18 of 46 positions shown; findings below may reference images not displayed]

FINDINGS: Cardiovascular: Heart size normal. Trace pericardial fluid. RV is
nondilated. Satisfactory opacification of pulmonary arteries noted,
and there is no evidence of pulmonary emboli. Adequate contrast
opacification of the thoracic aorta with no evidence of dissection,
aneurysm, or stenosis. There is classic 3-vessel brachiocephalic
arch anatomy without proximal stenosis. No significant atheromatous
change.

Mediastinum/Nodes: No hilar or mediastinal adenopathy.

Lungs/Pleura: No pleural effusion. No pneumothorax. Extensive
scattered poorly marginated small airspace opacities throughout
basilar segments of both lower lobes, right middle lobe and lingula.
Mild scattered airspace opacities in the upper lobes, right worse
than left. There are focal areas of fairly dense consolidation in
the lung bases and right middle lobe, with air bronchograms.

Upper Abdomen: No acute findings.

Musculoskeletal: No chest wall abnormality. No acute or significant
osseous findings.

Review of the MIP images confirms the above findings.
IMPRESSION: 1.   Negative for acute PE or thoracic aortic dissection.
2. Extensive bilateral airspace disease involving bases more than
apices suggesting multifocal pneumonia or other infectious process,
hypersensitivity or inflammatory process, less likely atypical
edema.

## 2022-02-14 IMAGING — CR DG ABDOMEN 2V
1 series · 2 of 2 positions shown · non-contrast
Comparison: None.

CLINICAL DATA: Dyspepsia, abdominal pain

EXAM:
ABDOMEN - 2 VIEW

[Series 1: dg abd 2 views · 0.14mm/px · 2 of 2 slices shown]
[im 1/2]
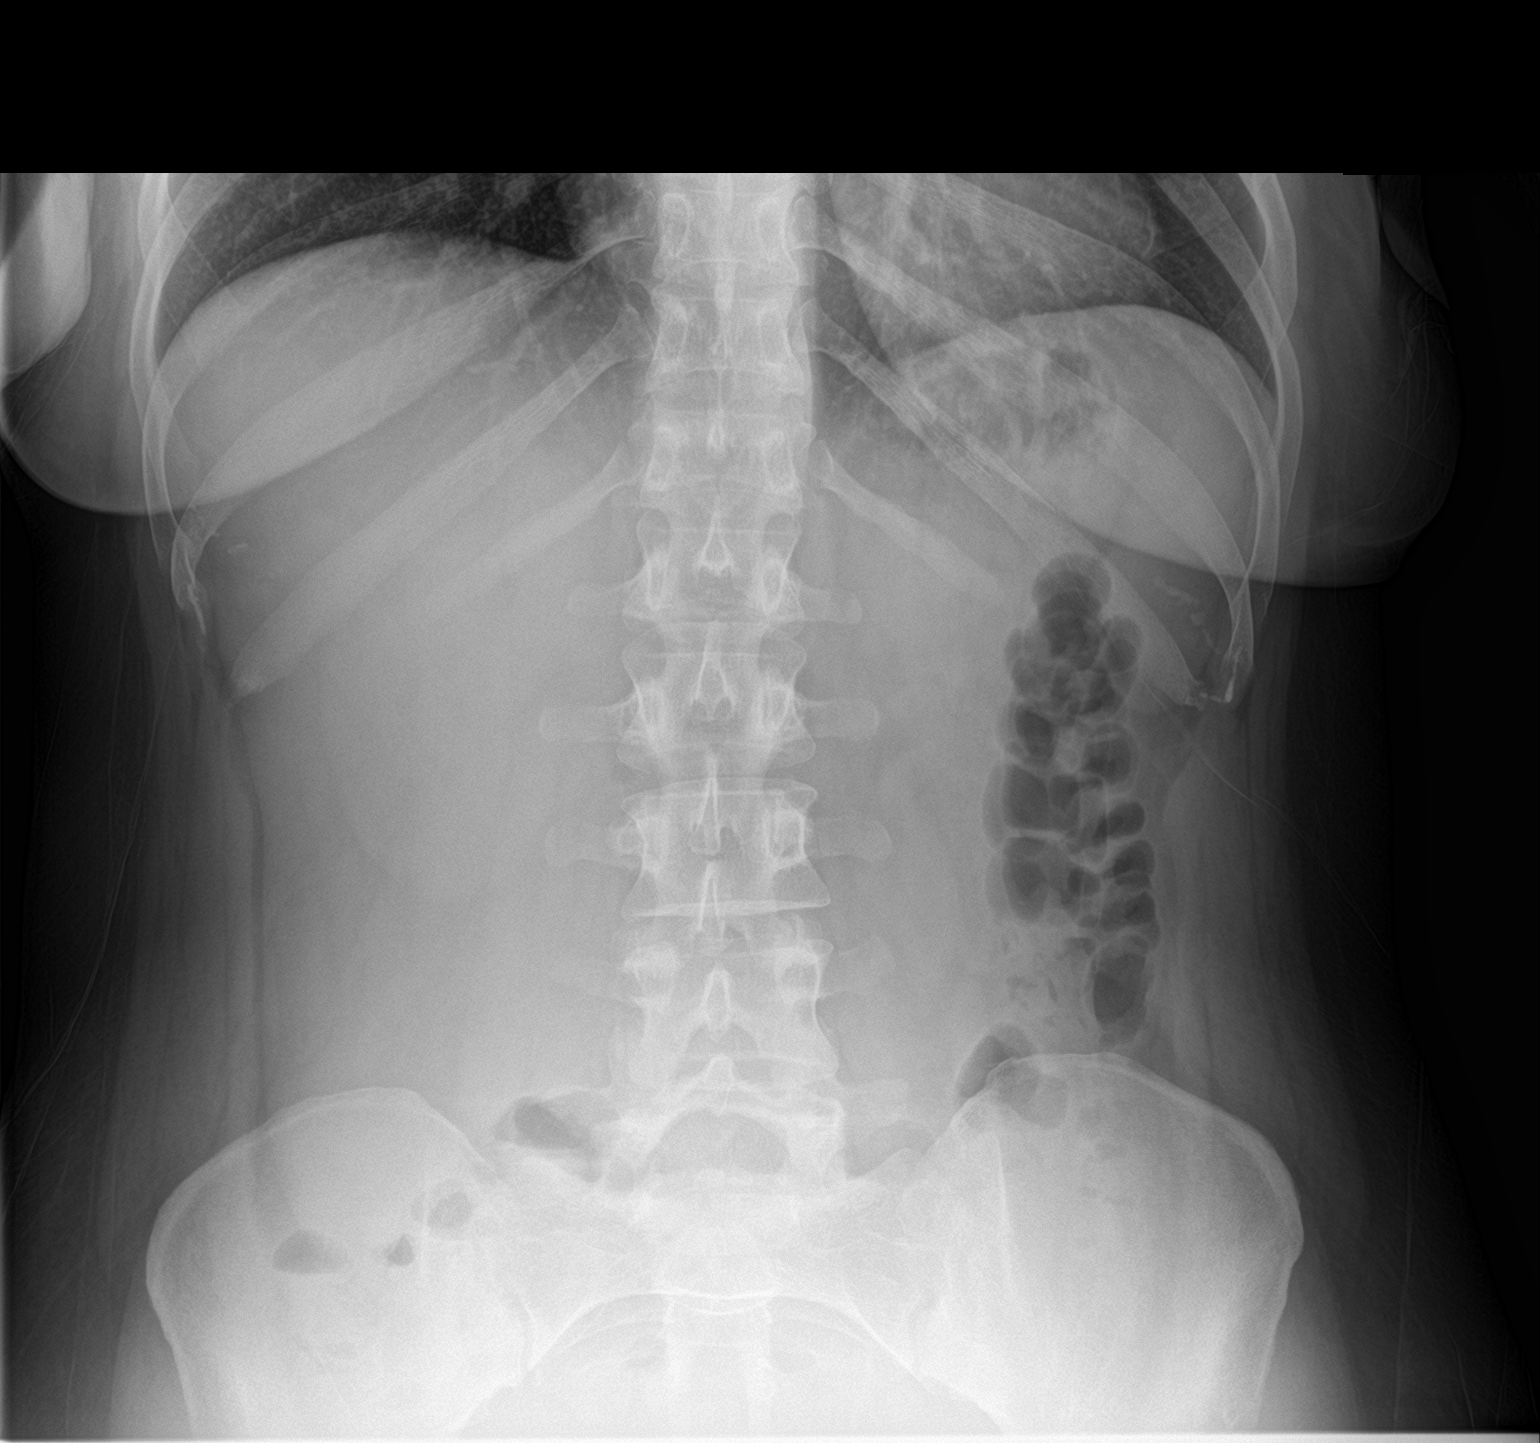
[im 2/2]
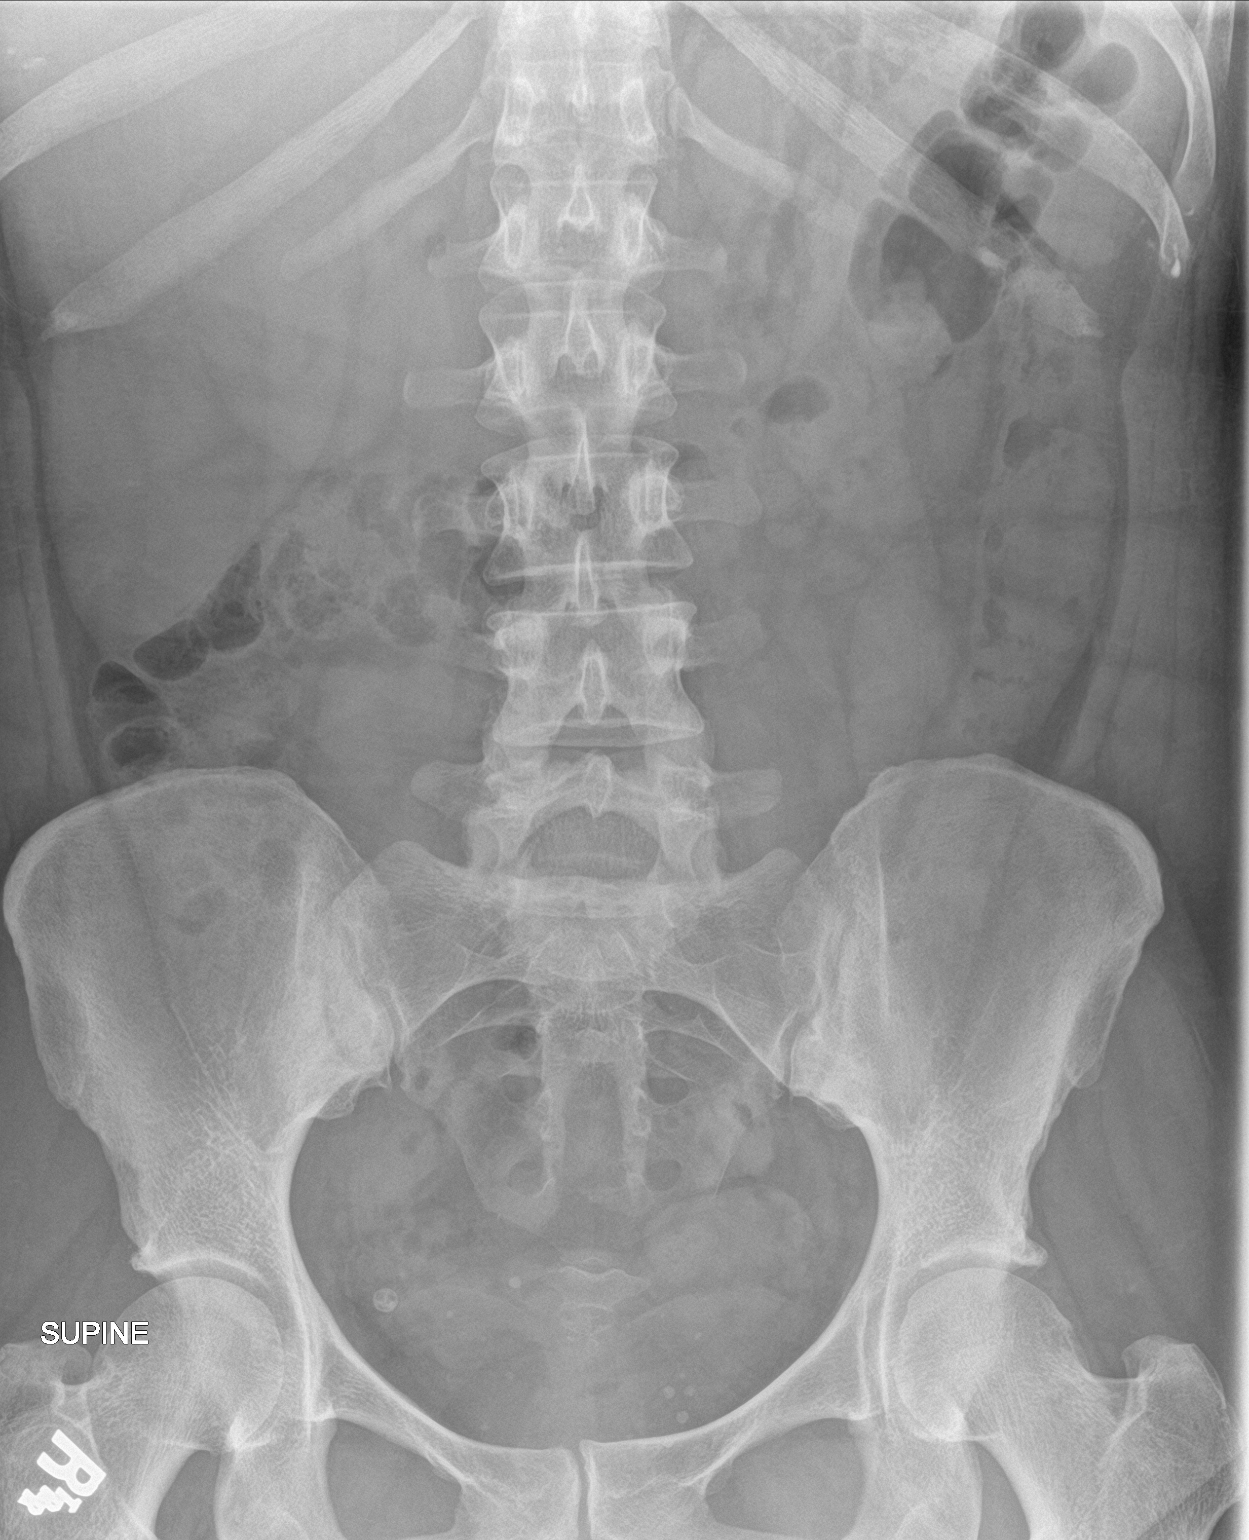

[2 of 2 positions shown; findings below may reference images not displayed]

FINDINGS: The bowel gas pattern is normal. There is no evidence of free air.
No radio-opaque calculi or other significant radiographic
abnormality is seen.
IMPRESSION: Negative.
